# Patient Record
Sex: Female | Born: 1975
Health system: Southern US, Community
[De-identification: ages and names within clinical notes are randomized; demographics above are authoritative.]

## PROBLEM LIST (undated history)

## (undated) DIAGNOSIS — D649 Anemia, unspecified: Secondary | ICD-10-CM

## (undated) DIAGNOSIS — I10 Essential (primary) hypertension: Secondary | ICD-10-CM

## (undated) DIAGNOSIS — M329 Systemic lupus erythematosus, unspecified: Secondary | ICD-10-CM

## (undated) DIAGNOSIS — R011 Cardiac murmur, unspecified: Secondary | ICD-10-CM

## (undated) DIAGNOSIS — T7840XA Allergy, unspecified, initial encounter: Secondary | ICD-10-CM

## (undated) DIAGNOSIS — M069 Rheumatoid arthritis, unspecified: Secondary | ICD-10-CM

## (undated) DIAGNOSIS — D219 Benign neoplasm of connective and other soft tissue, unspecified: Secondary | ICD-10-CM

## (undated) DIAGNOSIS — R51 Headache: Secondary | ICD-10-CM

## (undated) DIAGNOSIS — IMO0002 Reserved for concepts with insufficient information to code with codable children: Secondary | ICD-10-CM

## (undated) DIAGNOSIS — F419 Anxiety disorder, unspecified: Secondary | ICD-10-CM

## (undated) HISTORY — PX: REDUCTION MAMMAPLASTY: SUR839

## (undated) HISTORY — DX: Allergy, unspecified, initial encounter: T78.40XA

## (undated) HISTORY — PX: ABDOMINAL HYSTERECTOMY: SHX81

## (undated) HISTORY — DX: Essential (primary) hypertension: I10

## (undated) HISTORY — PX: NO PAST SURGERIES: SHX2092

---

## 1998-02-15 ENCOUNTER — Inpatient Hospital Stay (HOSPITAL_COMMUNITY): Admission: AD | Admit: 1998-02-15 | Discharge: 1998-02-15 | Payer: Self-pay | Admitting: *Deleted

## 1999-01-08 ENCOUNTER — Emergency Department (HOSPITAL_COMMUNITY): Admission: EM | Admit: 1999-01-08 | Discharge: 1999-01-08 | Payer: Self-pay | Admitting: Emergency Medicine

## 1999-01-08 ENCOUNTER — Encounter: Payer: Self-pay | Admitting: Emergency Medicine

## 1999-05-23 ENCOUNTER — Emergency Department (HOSPITAL_COMMUNITY): Admission: EM | Admit: 1999-05-23 | Discharge: 1999-05-23 | Payer: Self-pay

## 1999-08-19 ENCOUNTER — Emergency Department (HOSPITAL_COMMUNITY): Admission: EM | Admit: 1999-08-19 | Discharge: 1999-08-19 | Payer: Self-pay | Admitting: Emergency Medicine

## 2000-04-04 ENCOUNTER — Emergency Department (HOSPITAL_COMMUNITY): Admission: EM | Admit: 2000-04-04 | Discharge: 2000-04-04 | Payer: Self-pay | Admitting: Emergency Medicine

## 2000-07-01 ENCOUNTER — Emergency Department (HOSPITAL_COMMUNITY): Admission: EM | Admit: 2000-07-01 | Discharge: 2000-07-01 | Payer: Self-pay | Admitting: Emergency Medicine

## 2000-08-18 ENCOUNTER — Emergency Department (HOSPITAL_COMMUNITY): Admission: EM | Admit: 2000-08-18 | Discharge: 2000-08-18 | Payer: Self-pay | Admitting: Emergency Medicine

## 2007-08-20 ENCOUNTER — Other Ambulatory Visit: Admission: RE | Admit: 2007-08-20 | Discharge: 2007-08-20 | Payer: Self-pay | Admitting: Family Medicine

## 2008-05-23 ENCOUNTER — Inpatient Hospital Stay (HOSPITAL_COMMUNITY): Admission: AD | Admit: 2008-05-23 | Discharge: 2008-05-23 | Payer: Self-pay | Admitting: Family Medicine

## 2008-05-23 ENCOUNTER — Ambulatory Visit: Payer: Self-pay | Admitting: Physician Assistant

## 2008-09-29 ENCOUNTER — Other Ambulatory Visit: Admission: RE | Admit: 2008-09-29 | Discharge: 2008-09-29 | Payer: Self-pay | Admitting: Family Medicine

## 2010-02-21 ENCOUNTER — Emergency Department (HOSPITAL_BASED_OUTPATIENT_CLINIC_OR_DEPARTMENT_OTHER): Admission: EM | Admit: 2010-02-21 | Discharge: 2010-02-21 | Payer: Self-pay | Admitting: Emergency Medicine

## 2010-07-31 LAB — URINALYSIS, ROUTINE W REFLEX MICROSCOPIC
Bilirubin Urine: NEGATIVE
Glucose, UA: NEGATIVE mg/dL
Hgb urine dipstick: NEGATIVE
Ketones, ur: NEGATIVE mg/dL
Nitrite: NEGATIVE
Protein, ur: NEGATIVE mg/dL
Specific Gravity, Urine: 1.025 (ref 1.005–1.030)
Urobilinogen, UA: 0.2 mg/dL (ref 0.0–1.0)
pH: 5.5 (ref 5.0–8.0)

## 2010-07-31 LAB — WET PREP, GENITAL
Clue Cells Wet Prep HPF POC: NONE SEEN
Trich, Wet Prep: NONE SEEN
Yeast Wet Prep HPF POC: NONE SEEN

## 2010-07-31 LAB — GC/CHLAMYDIA PROBE AMP, GENITAL
Chlamydia, DNA Probe: NEGATIVE
GC Probe Amp, Genital: NEGATIVE

## 2010-07-31 LAB — POCT PREGNANCY, URINE: Preg Test, Ur: NEGATIVE

## 2010-09-13 ENCOUNTER — Emergency Department (HOSPITAL_BASED_OUTPATIENT_CLINIC_OR_DEPARTMENT_OTHER)
Admission: EM | Admit: 2010-09-13 | Discharge: 2010-09-13 | Disposition: A | Discharge status: Home / Self Care | Payer: Self-pay | Attending: Emergency Medicine | Admitting: Emergency Medicine

## 2010-09-13 DIAGNOSIS — M25539 Pain in unspecified wrist: Secondary | ICD-10-CM | POA: Insufficient documentation

## 2011-03-19 ENCOUNTER — Emergency Department (INDEPENDENT_AMBULATORY_CARE_PROVIDER_SITE_OTHER): Payer: Medicaid Other

## 2011-03-19 ENCOUNTER — Emergency Department (HOSPITAL_BASED_OUTPATIENT_CLINIC_OR_DEPARTMENT_OTHER)
Admission: EM | Admit: 2011-03-19 | Discharge: 2011-03-19 | Disposition: A | Discharge status: Home / Self Care | Payer: Medicaid Other | Attending: Emergency Medicine | Admitting: Emergency Medicine

## 2011-03-19 DIAGNOSIS — R1031 Right lower quadrant pain: Secondary | ICD-10-CM

## 2011-03-19 DIAGNOSIS — R109 Unspecified abdominal pain: Secondary | ICD-10-CM | POA: Insufficient documentation

## 2011-03-19 DIAGNOSIS — R110 Nausea: Secondary | ICD-10-CM

## 2011-03-19 DIAGNOSIS — R112 Nausea with vomiting, unspecified: Secondary | ICD-10-CM | POA: Insufficient documentation

## 2011-03-19 DIAGNOSIS — R509 Fever, unspecified: Secondary | ICD-10-CM | POA: Insufficient documentation

## 2011-03-19 DIAGNOSIS — N852 Hypertrophy of uterus: Secondary | ICD-10-CM

## 2011-03-19 HISTORY — DX: Benign neoplasm of connective and other soft tissue, unspecified: D21.9

## 2011-03-19 LAB — PREGNANCY, URINE: Preg Test, Ur: NEGATIVE

## 2011-03-19 LAB — BASIC METABOLIC PANEL
BUN: 7 mg/dL (ref 6–23)
Chloride: 101 mEq/L (ref 96–112)
Creatinine, Ser: 0.6 mg/dL (ref 0.50–1.10)
GFR calc Af Amer: >90 mL/min (ref >90)
Glucose, Bld: 102 mg/dL — ABNORMAL HIGH (ref 70–99)

## 2011-03-19 LAB — DIFFERENTIAL
Basophils Relative: 0 % (ref 0–1)
Eosinophils Absolute: 0 10*3/uL (ref 0.0–0.7)
Eosinophils Relative: 0 % (ref 0–5)
Lymphocytes Relative: 11 % — ABNORMAL LOW (ref 12–46)
Neutro Abs: 10.1 10*3/uL — ABNORMAL HIGH (ref 1.7–7.7)

## 2011-03-19 LAB — CBC
HCT: 28 % — ABNORMAL LOW (ref 36.0–46.0)
Hemoglobin: 8.9 g/dL — ABNORMAL LOW (ref 12.0–15.0)
MCH: 18.5 pg — ABNORMAL LOW (ref 26.0–34.0)
MCHC: 31.8 g/dL (ref 30.0–36.0)
RBC: 4.82 MIL/uL (ref 3.87–5.11)

## 2011-03-19 LAB — URINALYSIS, ROUTINE W REFLEX MICROSCOPIC
Leukocytes, UA: NEGATIVE
Protein, ur: 30 mg/dL — AB
Urobilinogen, UA: 0.2 mg/dL (ref 0.0–1.0)

## 2011-03-19 MED ORDER — SODIUM CHLORIDE 0.9 % IV BOLUS (SEPSIS)
1000.0000 mL | Freq: Once | INTRAVENOUS | Status: AC
  Administered 2011-03-19: 1000 mL via INTRAVENOUS

## 2011-03-19 MED ORDER — MORPHINE SULFATE 4 MG/ML IJ SOLN
INTRAMUSCULAR | Status: AC
  Filled 2011-03-19: qty 1

## 2011-03-19 MED ORDER — HYDROCODONE-ACETAMINOPHEN 5-325 MG PO TABS
1.0000 | ORAL_TABLET | Freq: Once | ORAL | Status: AC
  Administered 2011-03-19: 1 via ORAL
  Filled 2011-03-19: qty 1

## 2011-03-19 MED ORDER — ONDANSETRON HCL 4 MG PO TABS: 4.0000 mg | ORAL_TABLET | Freq: Four times a day (QID) | ORAL | Status: AC

## 2011-03-19 MED ORDER — OXYCODONE-ACETAMINOPHEN 5-325 MG PO TABS: 1.0000 | ORAL_TABLET | ORAL | Status: DC | PRN

## 2011-03-19 MED ORDER — MORPHINE SULFATE 4 MG/ML IJ SOLN
4.0000 mg | Freq: Once | INTRAMUSCULAR | Status: AC
  Administered 2011-03-19: 4 mg via INTRAVENOUS

## 2011-03-19 MED ORDER — IOHEXOL 300 MG/ML  SOLN
100.0000 mL | Freq: Once | INTRAMUSCULAR | Status: AC | PRN
  Administered 2011-03-19: 100 mL via INTRAVENOUS

## 2011-03-19 MED ORDER — ONDANSETRON HCL 4 MG/2ML IJ SOLN
4.0000 mg | Freq: Once | INTRAMUSCULAR | Status: AC
  Administered 2011-03-19: 4 mg via INTRAVENOUS

## 2011-03-19 MED ORDER — ONDANSETRON HCL 4 MG/2ML IJ SOLN
INTRAMUSCULAR | Status: AC
  Filled 2011-03-19: qty 2

## 2011-03-19 NOTE — ED Notes (Signed)
Pt reports abdominal pain x 4 days with nausea, constipation.

## 2011-03-19 NOTE — ED Provider Notes (Signed)
History     CSN: [id number] Arrival date & time: 03/19/2011  6:04 PM   First MD Initiated Contact with Patient 03/19/11 1806      Chief Complaint  Patient presents with  . Abdominal Pain    (Consider location/radiation/quality/duration/timing/severity/associated sxs/prior treatment) HPI Comments: Patient presents with right lower quadrant abdominal pain since last Thursday.  Patient notes it's been gradually getting worse.  The pain does radiate to the suprapubic region.  Patient notes that her last menstrual period started on Thursday as well and she does have heavy periods related to uterine fibroids.  She is also started rebleeding today.  She has no other vaginal discharge.  She has not noted fevers at home.  She has had associated nausea and vomiting.  She did feel constipated over the weekend he did use an enema and magnesium citrate to relieve the symptoms with good results.  Despite this she has continued to have pain.  Patient denies any past abdominal surgeries.  Denies any dysuria or kidney stones.  She has had decreased by mouth intake related to her pain.  Patient is a 35 y.o. female presenting with abdominal pain. The history is provided by the patient.  Abdominal Pain The primary symptoms of the illness include abdominal pain, nausea, vomiting and vaginal bleeding. The primary symptoms of the illness do not include fever, fatigue, shortness of breath, diarrhea, dysuria or vaginal discharge. The current episode started more than 2 days ago. The onset of the illness was gradual. The problem has been gradually worsening.  The patient states that she believes she is currently not pregnant. Additional symptoms associated with the illness include anorexia and constipation. Symptoms associated with the illness do not include chills or back pain.    Past Medical History  Diagnosis Date  . Fibroids     History reviewed. No pertinent past surgical history.  No family history on  file.  History  Substance Use Topics  . Smoking status: Never Smoker   . Smokeless tobacco: Not on file  . Alcohol Use: Yes     occasional    OB History    Grav Para Term Preterm Abortions TAB SAB Ect Mult Living                  Review of Systems  Constitutional: Negative.  Negative for fever, chills and fatigue.  HENT: Negative.   Eyes: Negative.  Negative for discharge and redness.  Respiratory: Negative.  Negative for cough and shortness of breath.   Cardiovascular: Negative.  Negative for chest pain.  Gastrointestinal: Positive for nausea, vomiting, abdominal pain, constipation and anorexia. Negative for diarrhea.  Genitourinary: Positive for vaginal bleeding. Negative for dysuria and vaginal discharge.  Musculoskeletal: Negative.  Negative for back pain.  Skin: Negative.  Negative for color change and rash.  Neurological: Negative.  Negative for syncope and headaches.  Hematological: Negative.  Negative for adenopathy.  Psychiatric/Behavioral: Negative.  Negative for confusion.  All other systems reviewed and are negative.    Allergies  Review of patient's allergies indicates no known allergies.  Home Medications   Current Outpatient Rx  Name Route Sig Dispense Refill  . CLOBETASOL PROPIONATE 0.05 % EX SOLN Topical Apply topically daily as needed. For flair up     . IBUPROFEN 200 MG PO TABS Oral Take 400 mg by mouth every 6 (six) hours as needed. For pain     . MAGNESIUM CITRATE 1.745 GM/30ML PO SOLN Oral Take 1 Bottle by mouth once.      Marland Kitchen  PSEUDOEPHEDRINE HCL 30 MG PO TABS Oral Take 30 mg by mouth every 4 (four) hours as needed. For congesiton     . SALINE NASAL SPRAY 0.65 % NA SOLN Each Nare Place 1 spray into both nostrils as needed. For congestion     . DISPOSABLE ENEMA 19-7 GM/118ML RE ENEM Rectal Place 1 enema rectally once. follow package directions       BP 153/81  Pulse 122  Temp(Src) 100.1 F (37.8 C) (Oral)  Resp 16  Ht 5\' 6"  (1.676 m)  Wt 180 lb  (81.647 kg)  BMI 29.05 kg/m2  SpO2 100%  LMP 03/19/2011  Physical Exam  Constitutional: She is oriented to person, place, and time. She appears well-developed and well-nourished.  Non-toxic appearance. She does not have a sickly appearance.  HENT:  Head: Normocephalic and atraumatic.  Eyes: Conjunctivae, EOM and lids are normal. Pupils are equal, round, and reactive to light. No scleral icterus.  Neck: Trachea normal and normal range of motion. Neck supple.  Cardiovascular: Regular rhythm, S1 normal, S2 normal and normal heart sounds.  Tachycardia present.   Pulmonary/Chest: Effort normal and breath sounds normal. She has no decreased breath sounds. She has no wheezes. She has no rhonchi. She has no rales.  Abdominal: Soft. Normal appearance. There is tenderness in the right lower quadrant and suprapubic area. There is no rebound, no guarding and no CVA tenderness.  Musculoskeletal: Normal range of motion.  Neurological: She is alert and oriented to person, place, and time. She has normal strength.  Skin: Skin is warm, dry and intact. No rash noted.  Psychiatric: She has a normal mood and affect. Her behavior is normal. Judgment and thought content normal.    ED Course  Procedures (including critical care time)  Results for orders placed during the hospital encounter of 03/19/11  URINALYSIS, ROUTINE W REFLEX MICROSCOPIC      Component Value Range   Color, Urine YELLOW  YELLOW    APPearance CLEAR  CLEAR    Specific Gravity, Urine 1.019  1.005 - 1.030    pH 7.0  5.0 - 8.0    Glucose, UA NEGATIVE  NEGATIVE (mg/dL)   Hgb urine dipstick MODERATE (*) NEGATIVE    Bilirubin Urine NEGATIVE  NEGATIVE    Ketones, ur 15 (*) NEGATIVE (mg/dL)   Protein, ur 30 (*) NEGATIVE (mg/dL)   Urobilinogen, UA 0.2  0.0 - 1.0 (mg/dL)   Nitrite NEGATIVE  NEGATIVE    Leukocytes, UA NEGATIVE  NEGATIVE   PREGNANCY, URINE      Component Value Range   Preg Test, Ur NEGATIVE    URINE MICROSCOPIC-ADD ON       Component Value Range   Squamous Epithelial / LPF RARE  RARE    RBC / HPF 7-10  <3 (RBC/hpf)   Bacteria, UA FEW (*) RARE    Urine-Other MUCOUS PRESENT    CBC      Component Value Range   WBC 12.8 (*) 4.0 - 10.5 (K/uL)   RBC 4.82  3.87 - 5.11 (MIL/uL)   Hemoglobin 8.9 (*) 12.0 - 15.0 (g/dL)   HCT 28.4 (*) 13.2 - 46.0 (%)   MCV 58.1 (*) 78.0 - 100.0 (fL)   MCH 18.5 (*) 26.0 - 34.0 (pg)   MCHC 31.8  30.0 - 36.0 (g/dL)   RDW 44.0 (*) 10.2 - 15.5 (%)   Platelets 621 (*) 150 - 400 (K/uL)  DIFFERENTIAL      Component Value Range   Neutrophils Relative  79 (*) 43 - 77 (%)   Lymphocytes Relative 11 (*) 12 - 46 (%)   Monocytes Relative 10  3 - 12 (%)   Eosinophils Relative 0  0 - 5 (%)   Basophils Relative 0  0 - 1 (%)   Neutro Abs 10.1 (*) 1.7 - 7.7 (K/uL)   Lymphs Abs 1.4  0.7 - 4.0 (K/uL)   Monocytes Absolute 1.3 (*) 0.1 - 1.0 (K/uL)   Eosinophils Absolute 0.0  0.0 - 0.7 (K/uL)   Basophils Absolute 0.0  0.0 - 0.1 (K/uL)  BASIC METABOLIC PANEL      Component Value Range   Sodium 136  135 - 145 (mEq/L)   Potassium 3.8  3.5 - 5.1 (mEq/L)   Chloride 101  96 - 112 (mEq/L)   CO2 24  19 - 32 (mEq/L)   Glucose, Bld 102 (*) 70 - 99 (mg/dL)   BUN 7  6 - 23 (mg/dL)   Creatinine, Ser 9.60  0.50 - 1.10 (mg/dL)   Calcium 9.6  8.4 - 45.4 (mg/dL)   GFR calc non Af Amer >90  >90 (mL/min)   GFR calc Af Amer >90  >90 (mL/min)   Ct Abdomen Pelvis W Contrast  03/19/2011  *RADIOLOGY REPORT*  Clinical Data: Right lower quadrant abdominal pain with nausea and fever for 4 days.  Question appendicitis.  CT ABDOMEN AND PELVIS WITH CONTRAST  Technique:  Multidetector CT imaging of the abdomen and pelvis was performed following the standard protocol during bolus administration of intravenous contrast.  Contrast: OMNIPAQUE IOHEXOL 300 MG/ML IV SOLN  Comparison: None.  Findings: There is a 4 mm subpleural nodule at the right lung base on image 6 which may reflect a small intrapulmonary lymph node. The lung  bases are otherwise clear.  There is no pleural effusion. A small hiatal hernia is noted.  The liver, spleen, gallbladder, pancreas, adrenal glands and kidneys appear normal.  There is no hydronephrosis.  The bowel gas pattern is normal.  No right lower quadrant inflammatory changes are identified.  The appendix is air-filled and normal in caliber without surrounding inflammation.  The appendix is best seen on axial image 52.  The uterus is moderately enlarged, measuring up to 16 cm in length and 15.4 cm transverse.  The uterine dimensions are diffusely irregular.  There is a large central low density lesion within the uterus, measuring 7.8 x 7.3 cm transverse on image 60.  There is no adnexal mass or pelvic inflammatory process.  The urinary bladder appears unremarkable.  No osseous abnormalities are identified.  IMPRESSION:  1.  No evidence of appendicitis. 2.  Moderate enlargement of the uterus with lobularity consistent with fibroids.  A large low density lesion centrally in the uterus is most likely a degenerated fibroid.  No adnexal abnormalities are identified. 3.  The abdominal visceral organs appear unremarkable.  Original Report Authenticated By: Gerrianne Scale, M.D.      MDM  Patient with unclear etiology for her symptoms.  She has no signs of appendicitis or other colitis on her CT abdomen pelvis.  She has no symptoms consistent with UTI in the blood in her urine is likely due to her abnormal vaginal bleeding related to known fibroids.  She has no symptoms for PID.  Patient has no significant respiratory symptoms to suggest pneumonia or that this is influenza.  Patient may have a viral gastroenteritis given her nausea vomiting and abdominal pain.  Patient has had some continued tachycardia that  appears to be related to her fever.  Patient is now tolerating by mouth and is starting to feel better with the IV fluids and pain medications.  I have advised the patient to return if she has continued  pain, fevers, vomiting and bleeding.  She also knows that she should followup with the gynecologist for her continued bleeding with her fibroids as well.       Nat Christen, MD 03/19/11 [phone]

## 2011-03-21 ENCOUNTER — Inpatient Hospital Stay (HOSPITAL_COMMUNITY): Payer: Medicaid Other

## 2011-03-21 ENCOUNTER — Encounter (HOSPITAL_COMMUNITY): Payer: Self-pay | Admitting: *Deleted

## 2011-03-21 ENCOUNTER — Inpatient Hospital Stay (HOSPITAL_COMMUNITY)
Admission: AD | Admit: 2011-03-21 | Discharge: 2011-03-22 | Disposition: A | Discharge status: Home / Self Care | Payer: Medicaid Other | Source: Ambulatory Visit | Attending: Family Medicine | Admitting: Family Medicine

## 2011-03-21 DIAGNOSIS — D259 Leiomyoma of uterus, unspecified: Secondary | ICD-10-CM | POA: Diagnosis present

## 2011-03-21 DIAGNOSIS — R109 Unspecified abdominal pain: Secondary | ICD-10-CM | POA: Insufficient documentation

## 2011-03-21 HISTORY — DX: Reserved for concepts with insufficient information to code with codable children: IMO0002

## 2011-03-21 HISTORY — DX: Systemic lupus erythematosus, unspecified: M32.9

## 2011-03-21 LAB — CBC
HCT: 25.3 % — ABNORMAL LOW (ref 36.0–46.0)
Hemoglobin: 7.7 g/dL — ABNORMAL LOW (ref 12.0–15.0)
MCH: 18.3 pg — ABNORMAL LOW (ref 26.0–34.0)
MCHC: 30.4 g/dL (ref 30.0–36.0)
MCV: 60.2 fL — ABNORMAL LOW (ref 78.0–100.0)
RDW: 18.7 % — ABNORMAL HIGH (ref 11.5–15.5)

## 2011-03-21 MED ORDER — KETOROLAC TROMETHAMINE 60 MG/2ML IM SOLN
60.0000 mg | Freq: Once | INTRAMUSCULAR | Status: AC
  Administered 2011-03-21: 60 mg via INTRAMUSCULAR
  Filled 2011-03-21: qty 2

## 2011-03-21 MED ORDER — PROMETHAZINE HCL 25 MG/ML IJ SOLN
25.0000 mg | Freq: Once | INTRAMUSCULAR | Status: AC
  Administered 2011-03-21: 25 mg via INTRAMUSCULAR

## 2011-03-21 MED ORDER — PROMETHAZINE HCL 25 MG/ML IJ SOLN
25.0000 mg | Freq: Once | INTRAMUSCULAR | Status: DC
  Filled 2011-03-21: qty 1

## 2011-03-21 NOTE — Progress Notes (Signed)
Pt with fibroids LMP 11/29, continues to have bleed and passing clots, having intense lower abd pain.

## 2011-03-21 NOTE — ED Provider Notes (Signed)
History     Chief Complaint  Patient presents with  . Abdominal Pain  . Vaginal Bleeding  . Fibroids   HPI 35 y.o. G2P1011. Pain started Saturday with period, heavy bleeding, seen on Tuesday at [facility], had CT showing fibroids. Percocet 5/325 q 4 hours, helps for about an hour, then pain comes back.     Past Medical History  Diagnosis Date  . Fibroids   . Lupus     Of the Skin only    Past Surgical History  Procedure Date  . No past surgeries     Family History  Problem Relation Age of Onset  . Diabetes Mother   . Hypertension Mother   . Hyperlipidemia Father   . Cancer Paternal Aunt   . Diabetes Maternal Grandmother   . Diabetes Paternal Grandmother     History  Substance Use Topics  . Smoking status: Never Smoker   . Smokeless tobacco: Not on file  . Alcohol Use: Yes     occasional    Allergies: No Known Allergies  Prescriptions prior to admission  Medication Sig Dispense Refill  . clobetasol (TEMOVATE) 0.05 % external solution Apply topically daily as needed. For flair up       . ibuprofen (ADVIL,MOTRIN) 200 MG tablet Take 400 mg by mouth every 6 (six) hours as needed. For pain       . magnesium citrate 1.745 GM/30ML SOLN Take 1 Bottle by mouth once.        . ondansetron (ZOFRAN) 4 MG tablet Take 1 tablet (4 mg total) by mouth every 6 (six) hours.  12 tablet  0  . oxyCODONE-acetaminophen (PERCOCET) 5-325 MG per tablet Take 1 tablet by mouth every 4 (four) hours as needed for pain.  10 tablet  0  . pseudoephedrine (SUDAFED) 30 MG tablet Take 30 mg by mouth every 4 (four) hours as needed. For congesiton       . sodium chloride (OCEAN) 0.65 % nasal spray Place 1 spray into both nostrils as needed. For congestion       . sodium phosphate (FLEET) enema Place 1 enema rectally once. follow package directions         Review of Systems  Constitutional: Negative.   Respiratory: Negative.   Cardiovascular: Negative.   Gastrointestinal: Positive for nausea, vomiting  and abdominal pain. Negative for diarrhea and constipation.  Genitourinary: Negative for dysuria, urgency, frequency, hematuria and flank pain.       Positive vaginal bleeding   Musculoskeletal: Negative.   Neurological: Negative.   Psychiatric/Behavioral: Negative.    Physical Exam   Blood pressure 136/69, pulse 118, temperature 99.7 F (37.6 C), temperature source Oral, resp. rate 16, height 5\' 6"  (1.676 m), weight 187 lb 6.4 oz (85.004 kg), last menstrual period 03/14/2011.  Physical Exam  Constitutional: She is oriented to person, place, and time. She appears well-developed and well-nourished. No distress.  HENT:  Head: Normocephalic and atraumatic.  Cardiovascular: Normal rate, regular rhythm and normal heart sounds.   Respiratory: Effort normal and breath sounds normal. No respiratory distress.  GI: Soft. Bowel sounds are normal. She exhibits no distension and no mass. There is no tenderness. There is no rebound and no guarding.  Genitourinary: There is no rash or lesion on the right labia. There is no rash or lesion on the left labia. There is bleeding (small) around the vagina. No erythema or tenderness around the vagina. No vaginal discharge found.  Neurological: She is alert and oriented to  person, place, and time.  Skin: Skin is warm and dry.  Psychiatric: She has a normal mood and affect.    MAU Course  Procedures  Results for orders placed during the hospital encounter of 03/21/11 (from the past 24 hour(s))  CBC     Status: Abnormal   Collection Time   03/21/11 10:05 PM      Component Value Range   WBC 12.0 (*) 4.0 - 10.5 (K/uL)   RBC 4.20  3.87 - 5.11 (MIL/uL)   Hemoglobin 7.7 (*) 12.0 - 15.0 (g/dL)   HCT 16.1 (*) 09.6 - 46.0 (%)   MCV 60.2 (*) 78.0 - 100.0 (fL)   MCH 18.3 (*) 26.0 - 34.0 (pg)   MCHC 30.4  30.0 - 36.0 (g/dL)   RDW 04.5 (*) 40.9 - 15.5 (%)   Platelets 551 (*) 150 - 400 (K/uL)  WET PREP, GENITAL     Status: Abnormal   Collection Time   03/22/11  12:10 AM      Component Value Range   Yeast, Wet Prep NONE SEEN  NONE SEEN    Trich, Wet Prep NONE SEEN  NONE SEEN    Clue Cells, Wet Prep RARE (*) NONE SEEN    WBC, Wet Prep HPF POC FEW (*) NONE SEEN    HGB decreased from 8.9 on 12/4. Orthostatics negative. Pt denies symptoms. Bleeding light now.   Good pain relief with Toradol.   US Transvaginal Non-ob  03/21/2011  *RADIOLOGY REPORT*  Clinical Data: Right lower quadrant abdominal pain, vaginal bleeding; history of fibroids.  TRANSABDOMINAL AND TRANSVAGINAL ULTRASOUND OF PELVIS Technique:  Both transabdominal and transvaginal ultrasound examinations of the pelvis were performed. Transabdominal technique was performed for global imaging of the pelvis including uterus, ovaries, adnexal regions, and pelvic cul-de-sac.  Comparison: CT of the abdomen and pelvis performed 03/19/2011   It was necessary to proceed with endovaginal exam following the transabdominal exam to visualize the uterus and ovaries in greater detail.  Findings:  Uterus: Diffusely enlarged, as noted on CT.  The uterus measures 14.4 x 9.2 x 12.5 cm.  Multiple fibroids are noted within the uterus, the largest of which is on the right side of the uterus, likely intramural in nature, measuring 7.0 x 5.9 x 5.8 cm. The intramural nature is suggested on the prior CT, but not well characterized on ultrasound.  There is a smaller 3.4 x 3.0 x 2.6 cm fibroid noted at the fundus of the uterus, likely also intramural in nature, and a partially exophytic fibroid arising along the left uterine wall, measuring 2.1 cm in size.  Endometrium: Not characterized due to displacement by the large uterine fibroids.  Right ovary:  Normal appearance/no adnexal mass; measures 2.9 x 2.4 x 1.9 cm.  Left ovary: Normal appearance/no adnexal mass; measures 3.2 x 2.4 x 2.1 cm.  Limited color Doppler evaluation demonstrates normal color Doppler blood flow with respect to both ovaries; there is no evidence for ovarian  torsion.  Other findings: No free fluid is seen within the pelvic cul-de-sac.  IMPRESSION:  1.  Diffusely enlarged uterus, with multiple large fibroids again seen, as described above.  The endometrial echo complex is not characterized due to displacement by large uterine fibroids. 2.  No evidence for ovarian torsion.  Original Report Authenticated By: Tonia Ghent, M.D.   US Pelvis Complete  03/21/2011  *RADIOLOGY REPORT*  Clinical Data: Right lower quadrant abdominal pain, vaginal bleeding; history of fibroids.  TRANSABDOMINAL AND TRANSVAGINAL ULTRASOUND OF PELVIS Technique:  Both transabdominal and transvaginal ultrasound examinations of the pelvis were performed. Transabdominal technique was performed for global imaging of the pelvis including uterus, ovaries, adnexal regions, and pelvic cul-de-sac.  Comparison: CT of the abdomen and pelvis performed 03/19/2011   It was necessary to proceed with endovaginal exam following the transabdominal exam to visualize the uterus and ovaries in greater detail.  Findings:  Uterus: Diffusely enlarged, as noted on CT.  The uterus measures 14.4 x 9.2 x 12.5 cm.  Multiple fibroids are noted within the uterus, the largest of which is on the right side of the uterus, likely intramural in nature, measuring 7.0 x 5.9 x 5.8 cm. The intramural nature is suggested on the prior CT, but not well characterized on ultrasound.  There is a smaller 3.4 x 3.0 x 2.6 cm fibroid noted at the fundus of the uterus, likely also intramural in nature, and a partially exophytic fibroid arising along the left uterine wall, measuring 2.1 cm in size.  Endometrium: Not characterized due to displacement by the large uterine fibroids.  Right ovary:  Normal appearance/no adnexal mass; measures 2.9 x 2.4 x 1.9 cm.  Left ovary: Normal appearance/no adnexal mass; measures 3.2 x 2.4 x 2.1 cm.  Limited color Doppler evaluation demonstrates normal color Doppler blood flow with respect to both ovaries; there is  no evidence for ovarian torsion.  Other findings: No free fluid is seen within the pelvic cul-de-sac.  IMPRESSION:  1.  Diffusely enlarged uterus, with multiple large fibroids again seen, as described above.  The endometrial echo complex is not characterized due to displacement by large uterine fibroids. 2.  No evidence for ovarian torsion.  Original Report Authenticated By: Tonia Ghent, M.D.   Ct Abdomen Pelvis W Contrast  03/19/2011  *RADIOLOGY REPORT*  Clinical Data: Right lower quadrant abdominal pain with nausea and fever for 4 days.  Question appendicitis.  CT ABDOMEN AND PELVIS WITH CONTRAST  Technique:  Multidetector CT imaging of the abdomen and pelvis was performed following the standard protocol during bolus administration of intravenous contrast.  Contrast: OMNIPAQUE IOHEXOL 300 MG/ML IV SOLN  Comparison: None.  Findings: There is a 4 mm subpleural nodule at the right lung base on image 6 which may reflect a small intrapulmonary lymph node. The lung bases are otherwise clear.  There is no pleural effusion. A small hiatal hernia is noted.  The liver, spleen, gallbladder, pancreas, adrenal glands and kidneys appear normal.  There is no hydronephrosis.  The bowel gas pattern is normal.  No right lower quadrant inflammatory changes are identified.  The appendix is air-filled and normal in caliber without surrounding inflammation.  The appendix is best seen on axial image 52.  The uterus is moderately enlarged, measuring up to 16 cm in length and 15.4 cm transverse.  The uterine dimensions are diffusely irregular.  There is a large central low density lesion within the uterus, measuring 7.8 x 7.3 cm transverse on image 60.  There is no adnexal mass or pelvic inflammatory process.  The urinary bladder appears unremarkable.  No osseous abnormalities are identified.  IMPRESSION:  1.  No evidence of appendicitis. 2.  Moderate enlargement of the uterus with lobularity consistent with fibroids.  A large  low density lesion centrally in the uterus is most likely a degenerated fibroid.  No adnexal abnormalities are identified. 3.  The abdominal visceral organs appear unremarkable.  Original Report Authenticated By: Gerrianne Scale, M.D.    Assessment and Plan  35 y.o. [id number]  with fibroids F/U in GYN clinic  Medication List  As of 03/22/2011  4:01 AM   START taking these medications         ferrous sulfate 325 (65 FE) MG tablet   Take 1 tablet (325 mg total) by mouth 3 (three) times daily with meals.      naproxen 500 MG tablet   Commonly known as: NAPROSYN   Take 1 tablet (500 mg total) by mouth 2 (two) times daily with a meal.      promethazine 25 MG tablet   Commonly known as: PHENERGAN   Take 1 tablet (25 mg total) by mouth every 6 (six) hours as needed for nausea.         CHANGE how you take these medications         oxyCODONE-acetaminophen 5-325 MG per tablet   Commonly known as: PERCOCET   Take 2 tablets by mouth every 4 (four) hours as needed for pain.   What changed: dose         CONTINUE taking these medications         clobetasol 0.05 % external solution   Commonly known as: TEMOVATE      magnesium citrate 1.745 GM/30ML Soln      ondansetron 4 MG tablet   Commonly known as: ZOFRAN   Take 1 tablet (4 mg total) by mouth every 6 (six) hours.      pseudoephedrine 30 MG tablet   Commonly known as: SUDAFED      sodium chloride 0.65 % nasal spray   Commonly known as: OCEAN      sodium phosphate enema   Commonly known as: FLEET         STOP taking these medications         ibuprofen 200 MG tablet          Where to get your medications    These are the prescriptions that you need to pick up. We sent them to a specific pharmacy, so you will need to go there to get them.   [facility] - [place], [place] - [address]    [address] [place] [address]    Phone: [phone]        ferrous sulfate 325 (65 FE) MG tablet     naproxen 500 MG tablet   promethazine 25 MG tablet         You may get these medications from any pharmacy.         oxyCODONE-acetaminophen 5-325 MG per tablet             FRAZIER,NATALIE 03/21/2011, 10:19 PM

## 2011-03-22 ENCOUNTER — Encounter (HOSPITAL_COMMUNITY): Payer: Self-pay | Admitting: Advanced Practice Midwife

## 2011-03-22 DIAGNOSIS — D259 Leiomyoma of uterus, unspecified: Secondary | ICD-10-CM | POA: Diagnosis present

## 2011-03-22 LAB — GC/CHLAMYDIA PROBE AMP, GENITAL
Chlamydia, DNA Probe: NEGATIVE
GC Probe Amp, Genital: NEGATIVE

## 2011-03-22 MED ORDER — NAPROXEN 500 MG PO TABS: 500.0000 mg | ORAL_TABLET | Freq: Two times a day (BID) | ORAL | Status: AC

## 2011-03-22 MED ORDER — FERROUS SULFATE 325 (65 FE) MG PO TABS: 325.0000 mg | ORAL_TABLET | Freq: Three times a day (TID) | ORAL | Status: DC

## 2011-03-22 MED ORDER — PROMETHAZINE HCL 25 MG PO TABS: 25.0000 mg | ORAL_TABLET | Freq: Four times a day (QID) | ORAL | Status: DC | PRN

## 2011-03-22 MED ORDER — OXYCODONE-ACETAMINOPHEN 5-325 MG PO TABS: 2.0000 | ORAL_TABLET | ORAL | Status: AC | PRN

## 2011-03-22 NOTE — ED Provider Notes (Signed)
Chart reviewed and agree with management and plan.  

## 2011-04-17 ENCOUNTER — Encounter: Payer: Self-pay | Admitting: Obstetrics & Gynecology

## 2011-10-10 ENCOUNTER — Emergency Department (HOSPITAL_BASED_OUTPATIENT_CLINIC_OR_DEPARTMENT_OTHER): Payer: Self-pay

## 2011-10-10 ENCOUNTER — Emergency Department (HOSPITAL_BASED_OUTPATIENT_CLINIC_OR_DEPARTMENT_OTHER)
Admission: EM | Admit: 2011-10-10 | Discharge: 2011-10-10 | Disposition: A | Discharge status: Home / Self Care | Payer: Self-pay | Attending: Emergency Medicine | Admitting: Emergency Medicine

## 2011-10-10 ENCOUNTER — Encounter (HOSPITAL_BASED_OUTPATIENT_CLINIC_OR_DEPARTMENT_OTHER): Payer: Self-pay | Admitting: Emergency Medicine

## 2011-10-10 DIAGNOSIS — R109 Unspecified abdominal pain: Secondary | ICD-10-CM | POA: Insufficient documentation

## 2011-10-10 DIAGNOSIS — R112 Nausea with vomiting, unspecified: Secondary | ICD-10-CM | POA: Insufficient documentation

## 2011-10-10 DIAGNOSIS — R197 Diarrhea, unspecified: Secondary | ICD-10-CM | POA: Insufficient documentation

## 2011-10-10 LAB — CBC WITH DIFFERENTIAL/PLATELET
Basophils Relative: 0 % (ref 0–1)
Eosinophils Absolute: 0 10*3/uL (ref 0.0–0.7)
HCT: 37.8 % (ref 36.0–46.0)
Hemoglobin: 13.4 g/dL (ref 12.0–15.0)
Lymphs Abs: 0.6 10*3/uL — ABNORMAL LOW (ref 0.7–4.0)
MCH: 27.6 pg (ref 26.0–34.0)
MCHC: 35.4 g/dL (ref 30.0–36.0)
Monocytes Absolute: 0.5 10*3/uL (ref 0.1–1.0)
Monocytes Relative: 4 % (ref 3–12)
Neutro Abs: 9.8 10*3/uL — ABNORMAL HIGH (ref 1.7–7.7)
RBC: 4.85 MIL/uL (ref 3.87–5.11)

## 2011-10-10 LAB — URINALYSIS, ROUTINE W REFLEX MICROSCOPIC
Bilirubin Urine: NEGATIVE
Ketones, ur: NEGATIVE mg/dL
Nitrite: NEGATIVE
Specific Gravity, Urine: 1.02 (ref 1.005–1.030)
pH: 8 (ref 5.0–8.0)

## 2011-10-10 LAB — URINE MICROSCOPIC-ADD ON

## 2011-10-10 LAB — BASIC METABOLIC PANEL
BUN: 10 mg/dL (ref 6–23)
Chloride: 100 mEq/L (ref 96–112)
Creatinine, Ser: 0.6 mg/dL (ref 0.50–1.10)
GFR calc Af Amer: >90 mL/min (ref >90)
Glucose, Bld: 137 mg/dL — ABNORMAL HIGH (ref 70–99)

## 2011-10-10 LAB — LIPASE, BLOOD: Lipase: 21 U/L (ref 11–59)

## 2011-10-10 MED ORDER — ONDANSETRON 8 MG PO TBDP
8.0000 mg | ORAL_TABLET | Freq: Once | ORAL | Status: AC
  Administered 2011-10-10: 8 mg via ORAL
  Filled 2011-10-10: qty 1

## 2011-10-10 MED ORDER — PROMETHAZINE HCL 12.5 MG RE SUPP: 12.5000 mg | Freq: Four times a day (QID) | RECTAL | Status: DC | PRN

## 2011-10-10 MED ORDER — TRAMADOL HCL 50 MG PO TABS: 50.0000 mg | ORAL_TABLET | Freq: Four times a day (QID) | ORAL | Status: AC | PRN

## 2011-10-10 MED ORDER — FENTANYL CITRATE 0.05 MG/ML IJ SOLN
50.0000 ug | Freq: Once | INTRAMUSCULAR | Status: AC
  Administered 2011-10-10: 50 ug via INTRAMUSCULAR
  Filled 2011-10-10: qty 2

## 2011-10-10 MED ORDER — KETOROLAC TROMETHAMINE 60 MG/2ML IM SOLN
60.0000 mg | Freq: Once | INTRAMUSCULAR | Status: AC
Start: 2011-10-10 — End: 2011-10-10
  Administered 2011-10-10: 60 mg via INTRAMUSCULAR
  Filled 2011-10-10: qty 2

## 2011-10-10 NOTE — ED Provider Notes (Signed)
History     CSN: [id number]  Arrival date & time 10/10/11  0548   None     Chief Complaint  Patient presents with  . Abdominal Pain    (Consider location/radiation/quality/duration/timing/severity/associated sxs/prior treatment) Patient is a 36 y.o. female presenting with cramps. The history is provided by the patient.  Abdominal Cramping The primary symptoms of the illness include nausea, vomiting and diarrhea. The primary symptoms of the illness do not include shortness of breath or vaginal discharge. The current episode started 6 to 12 hours ago. The onset of the illness was gradual. The problem has not changed since onset. The vomiting began yesterday. Vomiting occurs 2 to 5 times per day. The emesis contains stomach contents. Risk factors for illness leading to emesis include suspect food intake.  The diarrhea began today. The diarrhea is watery. The diarrhea occurs 2 to 4 times per day. Risk factors for illness producing diarrhea include suspect food intake.  Associated with: none though she has had similar in the past and is on her menstrual cycle and usually gets bad cramps with it. The patient states that she believes she is currently not pregnant. The patient has had a change in bowel habit. Risk factors: none. Symptoms associated with the illness do not include chills, anorexia, diaphoresis, heartburn, constipation, urgency, hematuria, frequency or back pain.    Past Medical History  Diagnosis Date  . Fibroids   . Lupus     Of the Skin only    Past Surgical History  Procedure Date  . No past surgeries     Family History  Problem Relation Age of Onset  . Diabetes Mother   . Hypertension Mother   . Hyperlipidemia Father   . Cancer Paternal Aunt   . Diabetes Maternal Grandmother   . Diabetes Paternal Grandmother     History  Substance Use Topics  . Smoking status: Never Smoker   . Smokeless tobacco: Not on file  . Alcohol Use: Yes     occasional    OB  History    Grav Para Term Preterm Abortions TAB SAB Ect Mult Living   2 1 1  0 1 1 0 0 0 1      Review of Systems  Constitutional: Negative for chills and diaphoresis.  Respiratory: Negative for chest tightness and shortness of breath.   Cardiovascular: Negative for chest pain.  Gastrointestinal: Positive for nausea, vomiting and diarrhea. Negative for heartburn, constipation and anorexia.  Genitourinary: Negative for urgency, frequency, hematuria and vaginal discharge.  Musculoskeletal: Negative for back pain.  All other systems reviewed and are negative.    Allergies  Review of patient's allergies indicates no known allergies.  Home Medications   Current Outpatient Rx  Name Route Sig Dispense Refill  . CLOBETASOL PROPIONATE 0.05 % EX SOLN Topical Apply topically daily as needed. For flair up     . FERROUS SULFATE 325 (65 FE) MG PO TABS Oral Take 1 tablet (325 mg total) by mouth 3 (three) times daily with meals. 90 tablet 11  . MAGNESIUM CITRATE 1.745 GM/30ML PO SOLN Oral Take 1 Bottle by mouth once.      Marland Kitchen NAPROXEN 500 MG PO TABS Oral Take 1 tablet (500 mg total) by mouth 2 (two) times daily with a meal. 60 tablet 2  . PSEUDOEPHEDRINE HCL 30 MG PO TABS Oral Take 30 mg by mouth every 4 (four) hours as needed. For congesiton     . SALINE NASAL SPRAY 0.65 % NA SOLN  Each Nare Place 1 spray into both nostrils as needed. For congestion     . DISPOSABLE ENEMA 19-7 GM/118ML RE ENEM Rectal Place 1 enema rectally once. follow package directions       BP 137/77  Pulse 93  Temp 98 F (36.7 C)  Resp 16  SpO2 99%  LMP 10/05/2011  Physical Exam  Constitutional: She is oriented to person, place, and time. She appears well-developed and well-nourished. No distress.  HENT:  Head: Normocephalic and atraumatic.  Mouth/Throat: Oropharynx is clear and moist.  Eyes: Conjunctivae are normal. Pupils are equal, round, and reactive to light.  Neck: Normal range of motion. Neck supple.    Cardiovascular: Normal rate and regular rhythm.   Pulmonary/Chest: Effort normal and breath sounds normal. She has no wheezes. She has no rales.  Abdominal: Soft. Bowel sounds are normal. There is no tenderness. There is no rebound and no guarding.  Musculoskeletal: Normal range of motion.  Neurological: She is alert and oriented to person, place, and time.  Skin: Skin is warm and dry.  Psychiatric: She has a normal mood and affect.    ED Course  Procedures (including critical care time)  Labs Reviewed  CBC WITH DIFFERENTIAL - Abnormal; Notable for the following:    WBC 10.9 (*)     MCV 77.9 (*)     Neutrophils Relative 90 (*)     Neutro Abs 9.8 (*)     Lymphocytes Relative 6 (*)     Lymphs Abs 0.6 (*)     All other components within normal limits  BASIC METABOLIC PANEL  URINALYSIS, ROUTINE W REFLEX MICROSCOPIC  PREGNANCY, URINE  LIPASE, BLOOD   No results found.   No diagnosis found.    MDM  Urine was not catheterized, it is contaminated and the patient is having her menstrual cycle as explanation for RBCs.    Sleeping in room on reentry.  Suspect viral enteritis.  Works around [employer] and exam reassuring.  Return for fevers right lower quadrant pain or any concerns.  Follow up with your family doctor        Veleria Barnhardt K Cevin Rubinstein-Rasch, MD 10/10/11 [phone]

## 2011-10-10 NOTE — ED Notes (Signed)
Pt c/o abdominal cramping since 10 pm, started vomiting this am

## 2012-05-11 ENCOUNTER — Encounter: Payer: Medicaid Other | Admitting: Obstetrics & Gynecology

## 2012-05-18 ENCOUNTER — Ambulatory Visit (INDEPENDENT_AMBULATORY_CARE_PROVIDER_SITE_OTHER): Payer: Self-pay | Admitting: Obstetrics and Gynecology

## 2012-05-18 ENCOUNTER — Encounter: Payer: Self-pay | Admitting: Obstetrics and Gynecology

## 2012-05-18 ENCOUNTER — Other Ambulatory Visit (HOSPITAL_COMMUNITY)
Admission: RE | Admit: 2012-05-18 | Discharge: 2012-05-18 | Disposition: A | Discharge status: Home / Self Care | Payer: Self-pay | Source: Ambulatory Visit | Attending: Obstetrics and Gynecology | Admitting: Obstetrics and Gynecology

## 2012-05-18 VITALS — BP 130/76 | HR 90 | Temp 98.6°F | Ht 65.5 in | Wt 196.0 lb

## 2012-05-18 DIAGNOSIS — N938 Other specified abnormal uterine and vaginal bleeding: Secondary | ICD-10-CM | POA: Insufficient documentation

## 2012-05-18 DIAGNOSIS — D259 Leiomyoma of uterus, unspecified: Secondary | ICD-10-CM

## 2012-05-18 DIAGNOSIS — Z01812 Encounter for preprocedural laboratory examination: Secondary | ICD-10-CM

## 2012-05-18 DIAGNOSIS — N949 Unspecified condition associated with female genital organs and menstrual cycle: Secondary | ICD-10-CM

## 2012-05-18 NOTE — Progress Notes (Signed)
  Subjective:    Patient ID: Holly Valencia, female    DOB: 1975/11/19, 37 y.o.   MRN: [id number]  HPI  37 yo G2P1011 with LMP 04/24/2012 with BMI 32 presenting today for evaluation of fibroid uterus. Patient with known fibroid uterus was seen last year by [facility] but secondary to lack of insurance was not able to afford surgical intervention. Patient presents today reporting irregular cycles lasting 7-21 days accompanied with clots. She denies chest pain, SOB, lightheadedness/dizziness. Patient desires definitive treatment.  Past Medical History  Diagnosis Date  . Fibroids   . Lupus     Of the Skin only   Past Surgical History  Procedure Date  . No past surgeries    Family History  Problem Relation Age of Onset  . Diabetes Mother   . Hypertension Mother   . Hyperlipidemia Father   . Cancer Paternal Aunt   . Diabetes Maternal Grandmother   . Diabetes Paternal Grandmother    History  Substance Use Topics  . Smoking status: Never Smoker   . Smokeless tobacco: Never Used  . Alcohol Use: No     Comment: occasional     Review of Systems     Objective:   Physical Exam  GENERAL: Well-developed, well-nourished female in no acute distress.  HEENT: Normocephalic, atraumatic. Sclerae anicteric.  NECK: Supple. Normal thyroid.  LUNGS: Clear to auscultation bilaterally.  HEART: Regular rate and rhythm. ABDOMEN: Soft, nontender, nondistended. No organomegaly. PELVIC: Normal external female genitalia. Vagina is pink and rugated.  Normal discharge. Normal appearing cervix. Uterus is 18- week size, slightly mobile on right side. No adnexal mass or tenderness. EXTREMITIES: No cyanosis, clubbing, or edema, 2+ distal pulses.     Assessment & Plan:  37 yo with fibroid uterus - [facility] - Will obtain pelvic ultrasound to assess evolution of fibroid uterus - Endometrial biospy ENDOMETRIAL BIOPSY     The indications for endometrial  biopsy were reviewed.   Risks of the biopsy including cramping, bleeding, infection, uterine perforation, inadequate specimen and need for additional procedures  were discussed. The patient states she understands and agrees to undergo procedure today. Consent was signed. Time out was performed. Urine HCG was negative. A sterile speculum was placed in the patient's vagina and the cervix was prepped with Betadine. A single-toothed tenaculum was placed on the anterior lip of the cervix to stabilize it. The uterine cavity was sounded to a depth of 16 cm using the uterine sound. The 3 mm pipelle was introduced into the endometrial cavity without difficulty, 2 passes were made.  A  moderate amount of tissue was  sent to pathology. The instruments were removed from the patient's vagina. Minimal bleeding from the cervix was noted. The patient tolerated the procedure well.  Routine post-procedure instructions were given to the patient. The patient will follow up in two weeks to review the results and for further management.   - Patient cannot afford to be out of work without pay for 6 weeks and is interested in minimally invasive surgery. Will discuss mode of surgery pending ultrasound.

## 2012-05-18 NOTE — Patient Instructions (Signed)
Hysterectomy Information  A hysterectomy is a procedure where your uterus is surgically removed. It will no longer be possible to have menstrual periods or to become pregnant. The tubes and ovaries can be removed (bilateral salpingo-oopherectomy) during this surgery as well.  REASONS FOR A HYSTERECTOMY  Persistent, abnormal bleeding.  Lasting (chronic) pelvic pain or infection.  The lining of the uterus (endometrium) starts growing outside the uterus (endometriosis).  The endometrium starts growing in the muscle of the uterus (adenomyosis).  The uterus falls down into the vagina (pelvic organ prolapse).  Symptomatic uterine fibroids.  Precancerous cells.  Cervical cancer or uterine cancer. TYPES OF HYSTERECTOMIES  Supracervical hysterectomy. This type removes the top part of the uterus, but not the cervix.  Total hysterectomy. This type removes the uterus and cervix.  Radical hysterectomy. This type removes the uterus, cervix, and the fibrous tissue that holds the uterus in place in the pelvis (parametrium). WAYS A HYSTERECTOMY CAN BE PERFORMED  Abdominal hysterectomy. A large surgical cut (incision) is made in the abdomen. The uterus is removed through this incision.  Vaginal hysterectomy. An incision is made in the vagina. The uterus is removed through this incision. There are no abdominal incisions.  Conventional laparoscopic hysterectomy. A thin, lighted tube with a camera (laparoscope) is inserted into 3 or 4 small incisions in the abdomen. The uterus is cut into small pieces. The small pieces are removed through the incisions, or they are removed through the vagina.  Laparoscopic assisted vaginal hysterectomy (LAVH). Three or four small incisions are made in the abdomen. Part of the surgery is performed laparoscopically and part vaginally. The uterus is removed through the vagina.  Robot-assisted laparoscopic hysterectomy. A laparoscope is inserted into 3 or 4 small  incisions in the abdomen. A computer-controlled device is used to give the surgeon a 3D image. This allows for more precise movements of surgical instruments. The uterus is cut into small pieces and removed through the incisions or removed through the vagina. RISKS OF HYSTERECTOMY   Bleeding and risk of blood transfusion. Tell your caregiver if you do not want to receive any blood products.  Blood clots in the legs or lung.  Infection.  Injury to surrounding organs.  Anesthesia problems or side effects.  Conversion to an abdominal hysterectomy. WHAT TO EXPECT AFTER A HYSTERECTOMY  You will be given pain medicine.  You will need to have someone with you for the first 3 to 5 days after you go home.  You will need to follow up with your surgeon in 2 to 4 weeks after surgery to evaluate your progress.  You may have early menopause symptoms like hot flashes, night sweats, and insomnia.  If you had a hysterectomy for a problem that was not a cancer or a condition that could lead to cancer, then you no longer need Pap tests. However, even if you no longer need a Pap test, a regular exam is a good idea to make sure no other problems are starting. Document Released: 09/25/2000 Document Revised: 06/24/2011 Document Reviewed: 11/10/2010 [facility] Patient Information 2013 [place], [place].   Uterine Fibroid A uterine fibroid is a growth (tumor) that occurs in a woman's uterus. This type of tumor is not cancerous and does not spread out of the uterus. A woman can have one or many fibroids, and the fiboid(s) can become quite large. A fibroid can vary in size, weight, and where it grows in the uterus. Most fibroids do not require medical treatment, but some can  cause pain or heavy bleeding during and between periods. CAUSES  A fibroid is the result of a single uterine cell that keeps growing (unregulated), which is different than most cells in the human body. Most cells have a control mechanism that  keeps them from reproducing without control.  SYMPTOMS   Bleeding.  Pelvic pain and pressure.  Bladder problems due to the size of the fibroid.  Infertility and miscarriages depending on the size and location of the fibroid. DIAGNOSIS  A diagnosis is made by physical exam. Your caregiver may feel the lumpy tumors during a pelvic exam. Important information regarding size, location, and number of tumors can be gained by having an ultrasound. It is rare that other tests, such as a CT scan or MRI, are needed. TREATMENT   Your caregiver may recommend watchful waiting. This involves getting the fibroid checked by your caregiver to see if the fibroids grow or shrink.   Hormonal treatment or an intrauterine device (IUD) may be prescribed.   Surgery may be needed to remove the fibroids (myomectomy) or the uterus (hysterectomy). This depends on your situation. When fibroids interfere with fertility and a woman wants to become pregnant, a caregiver may recommend having the fibroids removed.  HOME CARE INSTRUCTIONS  Home care depends on how you were treated. In general:   Keep all follow-up appointments with your caregiver.   Only take medicine as told by your caregiver. Do not take aspirin. It can cause bleeding.   If you have excessive periods and soak tampons or pads in a half hour or less, contact your caregiver immediately. If your periods are troublesome but not so heavy, lie down with your feet raised slightly above your heart. Place cold packs on your lower abdomen.   If your periods are heavy, write down the number of pads or tampons you use per month. Bring this information to your caregiver.   Talk to your caregiver about taking iron pills.   Include green vegetables in your diet.   If you were prescribed a hormonal treatment, take the hormonal medicines as directed.   If you need surgery, ask your caregiver for information on your specific surgery.  SEEK IMMEDIATE  MEDICAL CARE IF:  You have pelvic pain or cramps not controlled with medicines.   You have a sudden increase in pelvic pain.   You have an increase of bleeding between and during periods.   You feel lightheaded or have fainting episodes.  MAKE SURE YOU:  Understand these instructions.  Will watch your condition.  Will get help right away if you are not doing well or get worse. Document Released: 03/29/2000 Document Revised: 06/24/2011 Document Reviewed: 04/22/2011 [facility] Patient Information 2013 [place], [place].

## 2012-05-20 ENCOUNTER — Ambulatory Visit (HOSPITAL_COMMUNITY)
Admission: RE | Admit: 2012-05-20 | Discharge: 2012-05-20 | Disposition: A | Discharge status: Home / Self Care | Payer: Self-pay | Source: Ambulatory Visit | Attending: Obstetrics and Gynecology | Admitting: Obstetrics and Gynecology

## 2012-05-20 DIAGNOSIS — N949 Unspecified condition associated with female genital organs and menstrual cycle: Secondary | ICD-10-CM | POA: Insufficient documentation

## 2012-05-20 DIAGNOSIS — D259 Leiomyoma of uterus, unspecified: Secondary | ICD-10-CM

## 2012-05-20 DIAGNOSIS — D252 Subserosal leiomyoma of uterus: Secondary | ICD-10-CM | POA: Insufficient documentation

## 2012-05-20 DIAGNOSIS — N938 Other specified abnormal uterine and vaginal bleeding: Secondary | ICD-10-CM | POA: Insufficient documentation

## 2012-05-20 DIAGNOSIS — D251 Intramural leiomyoma of uterus: Secondary | ICD-10-CM | POA: Insufficient documentation

## 2012-06-01 ENCOUNTER — Encounter: Payer: Self-pay | Admitting: Obstetrics and Gynecology

## 2012-06-01 ENCOUNTER — Ambulatory Visit: Payer: Self-pay | Admitting: Obstetrics and Gynecology

## 2012-06-01 VITALS — BP 144/87 | HR 100 | Ht 65.5 in | Wt 195.8 lb

## 2012-06-01 DIAGNOSIS — N938 Other specified abnormal uterine and vaginal bleeding: Secondary | ICD-10-CM

## 2012-06-01 DIAGNOSIS — D259 Leiomyoma of uterus, unspecified: Secondary | ICD-10-CM

## 2012-06-01 DIAGNOSIS — Z712 Person consulting for explanation of examination or test findings: Secondary | ICD-10-CM

## 2012-06-01 NOTE — Progress Notes (Signed)
Patient ID: Holly Valencia, female   DOB: December 17, 1975, 37 y.o.   MRN: [id number] 37 yo G2P1011 presenting today to discuss results of biopsy and ultrasound as well as surgical planning for management of menorrhagia secondary to fibroid uterus.  Results of the endometrial biospy which was benign secretory endometrium as well as ultrasound were reviewed and explained.  Uterus: Anteverted, anteflexed. Multiple fibroids again noted.  Overall measurement 14.8 x 14.1 x 7.2 cm (previously 14.4 x 12.5 x  9.2 cm). Dominant fibroid as follows:  Left uterine fundus, subserosal / intramural, 5.9 x 6.1 x 5.7 cm  (previously 5.3 x 4.9 x 4.3 cm)  Right uterine fundus/body, intramural, 7.1 x 5.8 x 5.5 cm  (previously 7.0 x 5.9 x 5.8 cm)  Uterine fundus, intramural, 4.2 x 4.2 x 4.0 cm  Left lower uterine segment subserosal / intramural, 3.7 x 3.9 x 3.3  cm (previously 2.1 x 2.1 x 2.1 cm)  Endometrium: 1.5 cm, borderline trilaminar in appearance with trace  fluid within the endometrium.  Right ovary: 3.5 x 1.8 x 1.7 cm. Normal.  Left ovary: 3.3 x 2.1 x 1.7 cm. Normal.  Other Findings: No free fluid  Patient desires to proceed with surgical intervention. Patient was counseled on hysterectomy with preservation of ovaries. Risks, benefits and alternatives were explained including but not limited to risks of bleeding, infection and damage to adjacent organs. Patient verbalized understanding and all questions were answered. Patient desires to have a supracervical hysterectomy with bilateral salpingectomy.

## 2012-06-02 ENCOUNTER — Encounter: Payer: Self-pay | Admitting: *Deleted

## 2012-06-08 ENCOUNTER — Encounter: Payer: Self-pay | Admitting: *Deleted

## 2012-06-10 ENCOUNTER — Ambulatory Visit: Payer: Self-pay | Admitting: Obstetrics and Gynecology

## 2012-06-15 ENCOUNTER — Inpatient Hospital Stay (HOSPITAL_COMMUNITY): Admission: RE | Admit: 2012-06-15 | Payer: Self-pay | Source: Ambulatory Visit | Admitting: Obstetrics and Gynecology

## 2012-06-15 ENCOUNTER — Encounter (HOSPITAL_COMMUNITY): Admission: RE | Payer: Self-pay | Source: Ambulatory Visit

## 2012-06-15 SURGERY — HYSTERECTOMY, SUPRACERVICAL, ABDOMINAL
Anesthesia: Choice | Site: Abdomen

## 2012-07-20 SURGERY — Surgical Case
Anesthesia: *Unknown

## 2012-08-24 ENCOUNTER — Encounter: Payer: Self-pay | Admitting: Obstetrics and Gynecology

## 2012-08-24 ENCOUNTER — Ambulatory Visit (INDEPENDENT_AMBULATORY_CARE_PROVIDER_SITE_OTHER): Payer: Self-pay | Admitting: Obstetrics and Gynecology

## 2012-08-24 VITALS — BP 129/80 | HR 103 | Ht 66.0 in | Wt 194.6 lb

## 2012-08-24 DIAGNOSIS — N938 Other specified abnormal uterine and vaginal bleeding: Secondary | ICD-10-CM

## 2012-08-24 DIAGNOSIS — N949 Unspecified condition associated with female genital organs and menstrual cycle: Secondary | ICD-10-CM

## 2012-08-24 DIAGNOSIS — D259 Leiomyoma of uterus, unspecified: Secondary | ICD-10-CM

## 2012-08-24 MED ORDER — FERROUS SULFATE 325 (65 FE) MG PO TABS: 325.0000 mg | ORAL_TABLET | Freq: Two times a day (BID) | ORAL | Status: DC

## 2012-08-24 MED ORDER — DOCUSATE SODIUM 100 MG PO CAPS: 100.0000 mg | ORAL_CAPSULE | Freq: Two times a day (BID) | ORAL | Status: DC | PRN

## 2012-08-24 NOTE — Progress Notes (Signed)
Patient ID: Holly Valencia, female   DOB: [birth year], 37 y.o.   MRN: [id number] 37 yo G2P1011 here to reschedule hysterectomy. Patient was last seen in February and was not able to have surgery done secondary to financial cost and desired to have it after her daughter's graduation. She is now ready to have it rescheduled following her daughter's graduation on 6/14. She reports persistent heavy cycles lasting 8-10 days. At times she does feel weak and is taking multivitamins with iron.  Reviewed risks and benefits of the procedure. All questions were answered. Patient will be schedule for supracervical hysterectomy with bilateral salpingectomy.

## 2012-08-27 ENCOUNTER — Telehealth: Payer: Self-pay | Admitting: *Deleted

## 2012-08-27 NOTE — Telephone Encounter (Signed)
Called [facility] [phone]- are closed for today- will need to call another day. If they do not have  Pap results need to be scheduled for one in our clinic

## 2012-08-27 NOTE — Telephone Encounter (Signed)
Message copied by Gerome Apley on Thu Aug 27, 2012  1:59 PM ------      Message from: CONSTANT, PEGGY      Created: Thu Aug 27, 2012  1:54 PM      Regarding: need pap smear result       Please call office to fax over pap smear result for the past year ------

## 2012-09-03 ENCOUNTER — Telehealth: Payer: Self-pay | Admitting: *Deleted

## 2012-09-03 ENCOUNTER — Encounter: Payer: Self-pay | Admitting: Obstetrics and Gynecology

## 2012-09-03 NOTE — Telephone Encounter (Signed)
Status: Signed            Called [facility] [phone]- are closed for today- will need to call another day. If they do not have Pap results need to be scheduled for one in our clinic        Simona Huh, RN at 08/27/2012 1:59 PM    Status: Signed             Message copied by Gerome Apley on Thu Aug 27, 2012 1:59 PM  ------  Message from: CONSTANT, PEGGY  Created: Thu Aug 27, 2012 1:54 PM  Regarding: need pap smear result  Please call office to fax over pap smear result for the past year  ------

## 2012-09-03 NOTE — Telephone Encounter (Signed)
Results received and given to Dr. Jolayne Panther for review

## 2012-09-03 NOTE — Telephone Encounter (Signed)
Called [facility], they state they did not receive the release, will resend release.

## 2012-09-03 NOTE — Progress Notes (Signed)
Patient ID: Holly Valencia, female   DOB: [birth year], 37 y.o.   MRN: [id number] Records [facility] which show a normal pap smear in 2010 but nothing since. Will have patient return for pap smear only prior to surgical intervention.

## 2012-10-08 ENCOUNTER — Encounter: Payer: Self-pay | Admitting: Advanced Practice Midwife

## 2012-10-08 ENCOUNTER — Ambulatory Visit (INDEPENDENT_AMBULATORY_CARE_PROVIDER_SITE_OTHER): Payer: Self-pay | Admitting: Advanced Practice Midwife

## 2012-10-08 VITALS — BP 145/85 | HR 102 | Temp 96.6°F | Ht 66.0 in | Wt 191.6 lb

## 2012-10-08 DIAGNOSIS — Z124 Encounter for screening for malignant neoplasm of cervix: Secondary | ICD-10-CM

## 2012-10-08 DIAGNOSIS — Z01419 Encounter for gynecological examination (general) (routine) without abnormal findings: Secondary | ICD-10-CM

## 2012-10-08 NOTE — Progress Notes (Signed)
  Subjective:     Holly Valencia is a 37 y.o. female and is here for a well-woman exam and Pap. The patient reports no problems.  She desires hysterectomy/salpingectomy and needs current Pap results prior to surgical consult.    History   Social History  . Marital Status: Single    Spouse Name: N/A    Number of Children: N/A  . Years of Education: N/A   Occupational History  . Not on file.   Social History Main Topics  . Smoking status: Never Smoker   . Smokeless tobacco: Never Used  . Alcohol Use: No     Comment: occasional  . Drug Use: No  . Sexually Active: Yes    Birth Control/ Protection: None   Other Topics Concern  . Not on file   Social History Narrative  . No narrative on file   Health Maintenance  Topic Date Due  . Pap Smear  05/02/1991  . Tetanus/tdap  05/01/1992  . Influenza Vaccine  12/14/2012    The following portions of the patient's history were reviewed and updated as appropriate: allergies, current medications, past family history, past medical history, past social history, past surgical history and problem list.  Review of Systems A comprehensive review of systems was negative.   Objective:    BP 145/85  Pulse 102  Temp(Src) 96.6 F (35.9 C) (Oral)  Ht 5\' 6"  (1.676 m)  Wt 86.909 kg (191 lb 9.6 oz)  BMI 30.94 kg/m2  LMP 09/24/2012 General appearance: alert, cooperative, appears stated age and no distress Neck: no adenopathy, no carotid bruit, no JVD, supple, symmetrical, trachea midline and thyroid not enlarged, symmetric, no tenderness/mass/nodules Lungs: clear to auscultation bilaterally Breasts: normal appearance, no masses or tenderness Heart: regular rate and rhythm, S1, S2 normal, no murmur, click, rub or gallop Abdomen: soft, non-tender; bowel sounds normal; no masses,  no organomegaly Pelvic: cervix normal in appearance, external genitalia normal, no adnexal masses or tenderness, no cervical motion tenderness, rectovaginal  septum normal, uterus normal size, shape, and consistency and vagina normal without discharge Extremities: extremities normal, atraumatic, no cyanosis or edema Skin: Skin color, texture, turgor normal. No rashes or lesions Neurologic: Alert and oriented X 3, normal strength and tone. Normal symmetric reflexes. Normal coordination and gait    Assessment:      1. Well woman exam with routine gynecological exam   2. Screening for malignant neoplasm of the cervix   3. Routine gynecological examination        Plan:     F/U with surgical consult appointment with MD F/U with well-woman visit annually, Pap per guidelines based on today's results

## 2012-10-20 ENCOUNTER — Encounter: Payer: Self-pay | Admitting: Obstetrics and Gynecology

## 2012-11-12 ENCOUNTER — Ambulatory Visit: Payer: Self-pay | Admitting: Obstetrics and Gynecology

## 2012-11-18 ENCOUNTER — Encounter (HOSPITAL_COMMUNITY): Payer: Self-pay

## 2012-11-18 ENCOUNTER — Ambulatory Visit (INDEPENDENT_AMBULATORY_CARE_PROVIDER_SITE_OTHER): Payer: Medicaid Other | Admitting: Obstetrics and Gynecology

## 2012-11-18 ENCOUNTER — Encounter: Payer: Self-pay | Admitting: Obstetrics and Gynecology

## 2012-11-18 ENCOUNTER — Encounter (HOSPITAL_COMMUNITY): Payer: Self-pay | Admitting: Pharmacist

## 2012-11-18 ENCOUNTER — Encounter (HOSPITAL_COMMUNITY)
Admission: RE | Admit: 2012-11-18 | Discharge: 2012-11-18 | Disposition: A | Discharge status: Home / Self Care | Payer: Medicaid Other | Source: Ambulatory Visit | Attending: Obstetrics and Gynecology | Admitting: Obstetrics and Gynecology

## 2012-11-18 VITALS — BP 151/100 | HR 94 | Temp 98.6°F | Ht 65.0 in | Wt 190.6 lb

## 2012-11-18 DIAGNOSIS — Z01812 Encounter for preprocedural laboratory examination: Secondary | ICD-10-CM | POA: Insufficient documentation

## 2012-11-18 DIAGNOSIS — Z124 Encounter for screening for malignant neoplasm of cervix: Secondary | ICD-10-CM

## 2012-11-18 DIAGNOSIS — Z01818 Encounter for other preprocedural examination: Secondary | ICD-10-CM | POA: Insufficient documentation

## 2012-11-18 HISTORY — DX: Cardiac murmur, unspecified: R01.1

## 2012-11-18 HISTORY — DX: Anemia, unspecified: D64.9

## 2012-11-18 HISTORY — DX: Headache: R51

## 2012-11-18 HISTORY — DX: Anxiety disorder, unspecified: F41.9

## 2012-11-18 LAB — CBC
HCT: 32 % — ABNORMAL LOW (ref 36.0–46.0)
Hemoglobin: 10.3 g/dL — ABNORMAL LOW (ref 12.0–15.0)
MCH: 21.4 pg — ABNORMAL LOW (ref 26.0–34.0)
MCV: 66.5 fL — ABNORMAL LOW (ref 78.0–100.0)
RBC: 4.81 MIL/uL (ref 3.87–5.11)
WBC: 5.2 10*3/uL (ref 4.0–10.5)

## 2012-11-18 NOTE — Progress Notes (Signed)
Patient ID: Holly Valencia, female   DOB: 14-Dec-1975, 37 y.o.   MRN: [id number] 37 yo G2P1011 presenting today for repeat pap smear, as the previous one indicated a poor sampling, in preparation for scheduled hysterectomy on 11/25/2012.  Pap smear collected  Questions regarding upcoming surgery answered.

## 2012-11-18 NOTE — Patient Instructions (Signed)
Your procedure is scheduled on:11/25/12  Enter through the Main Entrance at :1130 am Pick up desk phone and dial [phone] and inform us of your arrival.  Please call [phone] if you have any problems the morning of surgery.  Remember: Do not eat food after midnight:TUESDAY Clear liquids are ok until:9am WED   You may brush your teeth the morning of surgery.   DO NOT wear jewelry, eye make-up, lipstick,body lotion, or dark fingernail polish.  (Polished toes are ok) You may wear deodorant.  If you are to be admitted after surgery, leave suitcase in car until your room has been assigned. Patients discharged on the day of surgery will not be allowed to drive home. Wear loose fitting, comfortable clothes for your ride home.

## 2012-11-24 MED ORDER — CEFAZOLIN SODIUM-DEXTROSE 2-3 GM-% IV SOLR
2.0000 g | INTRAVENOUS | Status: AC
  Administered 2012-11-25: 2 g via INTRAVENOUS

## 2012-11-24 NOTE — H&P (Signed)
Holly Valencia is an 37 y.o. female G2P1011 presenting today for scheduled hysterectomy for definitive management of fibroid uterus and menorrhagia.  Pertinent Gynecological History: Menses: flow is excessive with use of 6 pads or tampons on heaviest days Bleeding: dysfunctional uterine bleeding Contraception: none DES exposure: denies Blood transfusions: none Sexually transmitted diseases: no past history Previous GYN Procedures: DNC  Last mammogram: n/a Last pap: normal Date: 11/2012 OB History: G2, P1011   Menstrual History: No LMP recorded.    Past Medical History  Diagnosis Date  . Fibroids   . Lupus     Of the Skin only  . Anemia   . Headache(784.0)   . Heart murmur     as a child  . Anxiety     no meds    Past Surgical History  Procedure Laterality Date  . No past surgeries      Family History  Problem Relation Age of Onset  . Diabetes Mother   . Hypertension Mother   . Hyperlipidemia Father   . Cancer Paternal Aunt   . Diabetes Maternal Grandmother   . Diabetes Paternal Grandmother     Social History:  reports that she has never smoked. She has never used smokeless tobacco. She reports that  drinks alcohol. She reports that she does not use illicit drugs.  Allergies:  Allergies  Allergen Reactions  . Latex Itching    Prescriptions prior to admission  Medication Sig Dispense Refill  . cholecalciferol (VITAMIN D) 1000 UNITS tablet Take 1,000 Units by mouth daily.      . clobetasol (TEMOVATE) 0.05 % external solution Apply topically daily as needed. For flare up      . ferrous sulfate (FERROUSUL) 325 (65 FE) MG tablet Take 1 tablet (325 mg total) by mouth 2 (two) times daily.  60 tablet  1  . Multiple Vitamins-Iron (MULTIVITAMINS WITH IRON) TABS Take 1 tablet by mouth daily.        Review of Systems  All other systems reviewed and are negative.    Blood pressure 142/92, temperature 98.6 F (37 C), resp. rate 18, SpO2 98.00%. Physical  Exam GENERAL: Well-developed, well-nourished female in no acute distress.  HEENT: Normocephalic, atraumatic. Sclerae anicteric.  NECK: Supple. Normal thyroid.  LUNGS: Clear to auscultation bilaterally.  HEART: Regular rate and rhythm. ABDOMEN: Soft, nontender, nondistended. PELVIC: Normal external female genitalia. 18-week size fibroid uterus. No adnexal mass or tenderness. EXTREMITIES: No cyanosis, clubbing, or edema, 2+ distal pulses.  Results for orders placed during the hospital encounter of 11/25/12 (from the past 24 hour(s))  PREGNANCY, URINE     Status: None   Collection Time    11/25/12 11:00 AM      Result Value Range   Preg Test, Ur NEGATIVE  NEGATIVE   05/2012 ultrasound Uterus: Anteverted, anteflexed. Multiple fibroids again noted.  Overall measurement 14.8 x 14.1 x 7.2 cm (previously 14.4 x 12.5 x  9.2 cm). Dominant fibroid as follows:  Left uterine fundus, subserosal / intramural, 5.9 x 6.1 x 5.7 cm  (previously 5.3 x 4.9 x 4.3 cm)  Right uterine fundus/body, intramural, 7.1 x 5.8 x 5.5 cm  (previously 7.0 x 5.9 x 5.8 cm)  Uterine fundus, intramural, 4.2 x 4.2 x 4.0 cm  Left lower uterine segment subserosal / intramural, 3.7 x 3.9 x 3.3  cm (previously 2.1 x 2.1 x 2.1 cm)  Endometrium: 1.5 cm, borderline trilaminar in appearance with trace  fluid within the endometrium.  Right ovary: 3.5 x  1.8 x 1.7 cm. Normal.  Left ovary: 3.3 x 2.1 x 1.7 cm. Normal.  Other Findings: No free fluid  05/2012- Benign endometrial biopsy  Assessment/Plan: 37 yo with menorrhagia secondary to fibroid uterus here for definitive treatment with hysterectomy -Risks, benefits and alternatives were explained to the patient including but not limited to risks of bleeding, infection, damage to adjacent organs. Patient verbalized understanding and all questions were answered. Patient wishes to proceed with a supracervical hysterectomy with bilateral salpingectomy.  Holly Valencia 11/25/2012, 11:47  AM

## 2012-11-25 ENCOUNTER — Encounter (HOSPITAL_COMMUNITY)
Admission: RE | Disposition: A | Discharge status: Home / Self Care | Payer: Self-pay | Source: Ambulatory Visit | Attending: Obstetrics and Gynecology

## 2012-11-25 ENCOUNTER — Encounter (HOSPITAL_COMMUNITY): Payer: Self-pay | Admitting: Anesthesiology

## 2012-11-25 ENCOUNTER — Observation Stay (HOSPITAL_COMMUNITY)
Admission: RE | Admit: 2012-11-25 | Discharge: 2012-11-27 | Disposition: A | Discharge status: Home / Self Care | Payer: Medicaid Other | Source: Ambulatory Visit | Attending: Obstetrics and Gynecology | Admitting: Obstetrics and Gynecology

## 2012-11-25 ENCOUNTER — Inpatient Hospital Stay (HOSPITAL_COMMUNITY): Payer: Medicaid Other | Admitting: Anesthesiology

## 2012-11-25 DIAGNOSIS — N920 Excessive and frequent menstruation with regular cycle: Principal | ICD-10-CM | POA: Insufficient documentation

## 2012-11-25 DIAGNOSIS — D250 Submucous leiomyoma of uterus: Secondary | ICD-10-CM | POA: Insufficient documentation

## 2012-11-25 DIAGNOSIS — N949 Unspecified condition associated with female genital organs and menstrual cycle: Secondary | ICD-10-CM

## 2012-11-25 DIAGNOSIS — D252 Subserosal leiomyoma of uterus: Secondary | ICD-10-CM | POA: Insufficient documentation

## 2012-11-25 DIAGNOSIS — D259 Leiomyoma of uterus, unspecified: Secondary | ICD-10-CM

## 2012-11-25 DIAGNOSIS — D251 Intramural leiomyoma of uterus: Secondary | ICD-10-CM | POA: Insufficient documentation

## 2012-11-25 DIAGNOSIS — N938 Other specified abnormal uterine and vaginal bleeding: Secondary | ICD-10-CM

## 2012-11-25 HISTORY — PX: SUPRACERVICAL ABDOMINAL HYSTERECTOMY: SHX5393

## 2012-11-25 LAB — PREGNANCY, URINE: Preg Test, Ur: NEGATIVE

## 2012-11-25 SURGERY — HYSTERECTOMY, SUPRACERVICAL, ABDOMINAL
Anesthesia: General | Site: Abdomen | Wound class: Clean Contaminated

## 2012-11-25 MED ORDER — KETOROLAC TROMETHAMINE 30 MG/ML IJ SOLN
INTRAMUSCULAR | Status: AC
  Filled 2012-11-25: qty 1

## 2012-11-25 MED ORDER — GLYCOPYRROLATE 0.2 MG/ML IJ SOLN
INTRAMUSCULAR | Status: DC | PRN
  Administered 2012-11-25: 0.6 mg via INTRAVENOUS

## 2012-11-25 MED ORDER — DIPHENHYDRAMINE HCL 12.5 MG/5ML PO ELIX: 12.5000 mg | ORAL_SOLUTION | Freq: Four times a day (QID) | ORAL | Status: DC | PRN

## 2012-11-25 MED ORDER — FERROUS SULFATE 325 (65 FE) MG PO TABS
325.0000 mg | ORAL_TABLET | Freq: Two times a day (BID) | ORAL | Status: DC
  Administered 2012-11-26 – 2012-11-27 (×3): 325 mg via ORAL
  Filled 2012-11-25 (×3): qty 1

## 2012-11-25 MED ORDER — ONDANSETRON HCL 4 MG/2ML IJ SOLN
INTRAMUSCULAR | Status: DC | PRN
  Administered 2012-11-25: 4 mg via INTRAVENOUS

## 2012-11-25 MED ORDER — MIDAZOLAM HCL 2 MG/2ML IJ SOLN: 0.5000 mg | Freq: Once | INTRAMUSCULAR | Status: DC | PRN

## 2012-11-25 MED ORDER — LIDOCAINE HCL (CARDIAC) 20 MG/ML IV SOLN
INTRAVENOUS | Status: DC | PRN
  Administered 2012-11-25: 30 mg via INTRAVENOUS
  Administered 2012-11-25: 70 mg via INTRAVENOUS

## 2012-11-25 MED ORDER — FENTANYL CITRATE 0.05 MG/ML IJ SOLN
25.0000 ug | INTRAMUSCULAR | Status: DC | PRN
  Administered 2012-11-25 (×4): 25 ug via INTRAVENOUS

## 2012-11-25 MED ORDER — GLYCOPYRROLATE 0.2 MG/ML IJ SOLN
INTRAMUSCULAR | Status: AC
  Filled 2012-11-25: qty 3

## 2012-11-25 MED ORDER — LACTATED RINGERS IV SOLN
INTRAVENOUS | Status: DC
  Administered 2012-11-25 – 2012-11-26 (×2): via INTRAVENOUS

## 2012-11-25 MED ORDER — SODIUM CHLORIDE 0.9 % IJ SOLN: 9.0000 mL | INTRAMUSCULAR | Status: DC | PRN

## 2012-11-25 MED ORDER — PROPOFOL 10 MG/ML IV BOLUS
INTRAVENOUS | Status: DC | PRN
  Administered 2012-11-25: 180 mg via INTRAVENOUS

## 2012-11-25 MED ORDER — KETOROLAC TROMETHAMINE 30 MG/ML IJ SOLN
INTRAMUSCULAR | Status: DC | PRN
  Administered 2012-11-25: 30 mg via INTRAVENOUS

## 2012-11-25 MED ORDER — FENTANYL CITRATE 0.05 MG/ML IJ SOLN
INTRAMUSCULAR | Status: AC
Start: 2012-11-25 — End: 2012-11-25
  Filled 2012-11-25: qty 5

## 2012-11-25 MED ORDER — NEOSTIGMINE METHYLSULFATE 1 MG/ML IJ SOLN
INTRAMUSCULAR | Status: AC
  Filled 2012-11-25: qty 1

## 2012-11-25 MED ORDER — HYDROMORPHONE HCL PF 1 MG/ML IJ SOLN
INTRAMUSCULAR | Status: DC | PRN
  Administered 2012-11-25: 1 mg via INTRAVENOUS

## 2012-11-25 MED ORDER — MEPERIDINE HCL 25 MG/ML IJ SOLN: 6.2500 mg | INTRAMUSCULAR | Status: DC | PRN

## 2012-11-25 MED ORDER — FENTANYL CITRATE 0.05 MG/ML IJ SOLN
INTRAMUSCULAR | Status: DC | PRN
  Administered 2012-11-25 (×5): 50 ug via INTRAVENOUS

## 2012-11-25 MED ORDER — PROMETHAZINE HCL 25 MG/ML IJ SOLN: 6.2500 mg | INTRAMUSCULAR | Status: DC | PRN

## 2012-11-25 MED ORDER — PROPOFOL 10 MG/ML IV EMUL
INTRAVENOUS | Status: AC
  Filled 2012-11-25: qty 20

## 2012-11-25 MED ORDER — NEOSTIGMINE METHYLSULFATE 1 MG/ML IJ SOLN
INTRAMUSCULAR | Status: DC | PRN
  Administered 2012-11-25: 3 mg via INTRAVENOUS

## 2012-11-25 MED ORDER — ONDANSETRON HCL 4 MG/2ML IJ SOLN: 4.0000 mg | Freq: Four times a day (QID) | INTRAMUSCULAR | Status: DC | PRN

## 2012-11-25 MED ORDER — ONDANSETRON HCL 4 MG/2ML IJ SOLN
INTRAMUSCULAR | Status: AC
  Filled 2012-11-25: qty 2

## 2012-11-25 MED ORDER — 0.9 % SODIUM CHLORIDE (POUR BTL) OPTIME
TOPICAL | Status: DC | PRN
  Administered 2012-11-25: 1000 mL

## 2012-11-25 MED ORDER — KETOROLAC TROMETHAMINE 30 MG/ML IJ SOLN: 15.0000 mg | Freq: Once | INTRAMUSCULAR | Status: DC | PRN

## 2012-11-25 MED ORDER — LIDOCAINE HCL (CARDIAC) 20 MG/ML IV SOLN
INTRAVENOUS | Status: AC
  Filled 2012-11-25: qty 5

## 2012-11-25 MED ORDER — BUPIVACAINE HCL (PF) 0.25 % IJ SOLN
INTRAMUSCULAR | Status: AC
  Filled 2012-11-25: qty 30

## 2012-11-25 MED ORDER — CEFAZOLIN SODIUM-DEXTROSE 2-3 GM-% IV SOLR
INTRAVENOUS | Status: AC
  Filled 2012-11-25: qty 50

## 2012-11-25 MED ORDER — FENTANYL CITRATE 0.05 MG/ML IJ SOLN
INTRAMUSCULAR | Status: AC
  Filled 2012-11-25: qty 2

## 2012-11-25 MED ORDER — NALOXONE HCL 0.4 MG/ML IJ SOLN: 0.4000 mg | INTRAMUSCULAR | Status: DC | PRN

## 2012-11-25 MED ORDER — DEXAMETHASONE SODIUM PHOSPHATE 10 MG/ML IJ SOLN
INTRAMUSCULAR | Status: DC | PRN
  Administered 2012-11-25: 10 mg via INTRAVENOUS

## 2012-11-25 MED ORDER — DIPHENHYDRAMINE HCL 50 MG/ML IJ SOLN: 12.5000 mg | Freq: Four times a day (QID) | INTRAMUSCULAR | Status: DC | PRN

## 2012-11-25 MED ORDER — DEXAMETHASONE SODIUM PHOSPHATE 10 MG/ML IJ SOLN
INTRAMUSCULAR | Status: AC
  Filled 2012-11-25: qty 1

## 2012-11-25 MED ORDER — MIDAZOLAM HCL 2 MG/2ML IJ SOLN
INTRAMUSCULAR | Status: AC
  Filled 2012-11-25: qty 2

## 2012-11-25 MED ORDER — LACTATED RINGERS IV SOLN
INTRAVENOUS | Status: DC
  Administered 2012-11-25 (×2): via INTRAVENOUS

## 2012-11-25 MED ORDER — ONDANSETRON HCL 4 MG/2ML IJ SOLN
4.0000 mg | Freq: Four times a day (QID) | INTRAMUSCULAR | Status: DC | PRN
  Administered 2012-11-25: 4 mg via INTRAVENOUS
  Filled 2012-11-25: qty 2

## 2012-11-25 MED ORDER — MIDAZOLAM HCL 5 MG/5ML IJ SOLN
INTRAMUSCULAR | Status: DC | PRN
  Administered 2012-11-25: 2 mg via INTRAVENOUS

## 2012-11-25 MED ORDER — MORPHINE SULFATE (PF) 1 MG/ML IV SOLN
INTRAVENOUS | Status: DC
Start: 2012-11-25 — End: 2012-11-26
  Administered 2012-11-25: 19:00:00 via INTRAVENOUS
  Administered 2012-11-25: 15 mg via INTRAVENOUS
  Administered 2012-11-25: 23:00:00 via INTRAVENOUS
  Administered 2012-11-26: 9 mg via INTRAVENOUS
  Administered 2012-11-26: 19.5 mg via INTRAVENOUS
  Filled 2012-11-25 (×2): qty 25

## 2012-11-25 MED ORDER — BUPIVACAINE HCL (PF) 0.25 % IJ SOLN
INTRAMUSCULAR | Status: DC | PRN
  Administered 2012-11-25: 28 mL

## 2012-11-25 MED ORDER — HYDROMORPHONE HCL PF 1 MG/ML IJ SOLN
INTRAMUSCULAR | Status: AC
  Filled 2012-11-25: qty 1

## 2012-11-25 MED ORDER — HYDROMORPHONE 0.3 MG/ML IV SOLN
INTRAVENOUS | Status: DC
  Filled 2012-11-25: qty 25

## 2012-11-25 MED ORDER — ROCURONIUM BROMIDE 100 MG/10ML IV SOLN
INTRAVENOUS | Status: DC | PRN
  Administered 2012-11-25: 10 mg via INTRAVENOUS
  Administered 2012-11-25: 40 mg via INTRAVENOUS

## 2012-11-25 SURGICAL SUPPLY — 31 items
CANISTER SUCTION 2500CC (MISCELLANEOUS) ×2 IMPLANT
CHLORAPREP W/TINT 26ML (MISCELLANEOUS) ×2 IMPLANT
CLOTH BEACON ORANGE TIMEOUT ST (SAFETY) ×2 IMPLANT
CONT PATH 16OZ SNAP LID 3702 (MISCELLANEOUS) ×2 IMPLANT
DECANTER SPIKE VIAL GLASS SM (MISCELLANEOUS) IMPLANT
DRSG OPSITE POSTOP 4X10 (GAUZE/BANDAGES/DRESSINGS) ×2 IMPLANT
GAUZE SPONGE 4X4 12PLY STRL LF (GAUZE/BANDAGES/DRESSINGS) ×2 IMPLANT
GAUZE SPONGE 4X4 16PLY XRAY LF (GAUZE/BANDAGES/DRESSINGS) ×2 IMPLANT
GLOVE BIOGEL PI IND STRL 6.5 (GLOVE) ×1 IMPLANT
GLOVE BIOGEL PI INDICATOR 6.5 (GLOVE) ×1
GLOVE SURG SS PI 6.0 STRL IVOR (GLOVE) ×2 IMPLANT
GOWN PREVENTION PLUS LG XLONG (DISPOSABLE) ×6 IMPLANT
NEEDLE HYPO 25X1 1.5 SAFETY (NEEDLE) ×2 IMPLANT
NS IRRIG 1000ML POUR BTL (IV SOLUTION) ×2 IMPLANT
PACK ABDOMINAL GYN (CUSTOM PROCEDURE TRAY) ×2 IMPLANT
PAD OB MATERNITY 4.3X12.25 (PERSONAL CARE ITEMS) ×2 IMPLANT
PROTECTOR NERVE ULNAR (MISCELLANEOUS) ×2 IMPLANT
SEPRAFILM MEMBRANE 5X6 (MISCELLANEOUS) IMPLANT
SPONGE LAP 18X18 X RAY DECT (DISPOSABLE) IMPLANT
STAPLER VISISTAT 35W (STAPLE) IMPLANT
SUT PDS AB 1 CT1 36 (SUTURE) IMPLANT
SUT VIC AB 0 CT1 18XCR BRD8 (SUTURE) ×3 IMPLANT
SUT VIC AB 0 CT1 36 (SUTURE) ×8 IMPLANT
SUT VIC AB 0 CT1 8-18 (SUTURE) ×3
SUT VIC AB 3-0 FS2 27 (SUTURE) IMPLANT
SUT VIC AB 4-0 KS 27 (SUTURE) ×2 IMPLANT
SUT VICRYL 0 TIES 12 18 (SUTURE) ×2 IMPLANT
SYR CONTROL 10ML LL (SYRINGE) ×2 IMPLANT
TOWEL OR 17X24 6PK STRL BLUE (TOWEL DISPOSABLE) ×4 IMPLANT
TRAY FOLEY CATH 14FR (SET/KITS/TRAYS/PACK) ×2 IMPLANT
WATER STERILE IRR 1000ML POUR (IV SOLUTION) ×2 IMPLANT

## 2012-11-25 NOTE — Op Note (Addendum)
Holly Valencia PROCEDURE DATE: 11/25/2012  PREOPERATIVE DIAGNOSIS:  37 y.o. G2P1011 with symptomatic fibroids, menorrhagia POSTOPERATIVE DIAGNOSIS:  Same SURGEON:   Holly Valencia, M.D. ASSISTANT:   Holly Valencia, M.D. OPERATION:  Supracervical abdominal hysterectomy, Bilateral Salpingectomy ANESTHESIA:  General endotracheal.  INDICATIONS: The patient is a 37 y.o. G2P1011 with history of symptomatic uterine fibroids/menorrhagia. The patient made a decision to undergo definite surgical treatment. On the preoperative visit, the risks, benefits, indications, and alternatives of the procedure were reviewed with the patient.  On the day of surgery, the risks of surgery were again discussed with the patient including but not limited to: bleeding which may require transfusion or reoperation; infection which may require antibiotics; injury to bowel, bladder, ureters or other surrounding organs; need for additional procedures; thromboembolic phenomenon, incisional problems and other postoperative/anesthesia complications. Written informed consent was obtained.    OPERATIVE FINDINGS: A 18-week size fibroid uterus with normal tubes and ovaries bilaterally. Gross weight 820 gm  ESTIMATED BLOOD LOSS: 150 ml FLUIDS:  1600 ml of Lactated Ringers URINE OUTPUT:  600 ml of clear yellow urine. SPECIMENS:  Uterus and bilateral tubes sent to pathology COMPLICATIONS:  None immediate.   DESCRIPTION OF PROCEDURE: The patient received intravenous antibiotics and had sequential compression devices applied to her lower extremities while in the preoperative area.   She was taken to the operating room where anesthesia was induced and found to be adequate. The patient was placed in supine position. The abdomen and perineum were prepped and draped in a sterile manner, and a Foley catheter was inserted into the bladder and attached to Holly Valencia drainage. After an adequate timeout was performed, a Pfannensteil skin incision  was made. This incision was taken down to the fascia using electrocautery with care given to maintain good hemostasis. The fascia was incised in the midline and the fascial incision was then extended bilaterally using mayo scissors. The fascia was then dissected off the underlying rectus muscles using blunt and sharp dissection. The rectus muscles were split bluntly in the midline and the peritoneum entered sharply without complication. This peritoneal incision was then extended superiorly and inferiorly with care given to prevent bowel or bladder injury. Upon entry into the abdominal cavity, the upper abdomen was inspected and found to be normal. Attention was then turned to the pelvis. A retractor was placed into the incision, and the bowel was packed away with moist laparotomy sponges. The uterus at this point was noted to be mobilized. The round ligaments on each side were clamped, suture ligated, and transected with electrocautery allowing entry into the broad ligament. The anterior and posterior leaves of the broad ligament were separated and the ureters were inspected to be safely away from the area of dissection bilaterally. A hole was created in the clear portion of the posterior broad ligament. Adnexae were clamped on the patient's right side. This pedicle was then clamped, cut, and doubly suture ligated with good hemostasis. This procedure was repeated in an identical fashion on the left site allowing for both ovaries to remain in place.  A bladder flap was then created across the anterior leaf of the broad ligament and the bladder was then bluntly dissected off the lower uterine segment and cervix with good hemostasis. The uterine arteries were then skeletonized bilaterally and then clamped, cut, and ligated with care given to prevent ureteral injury. The uterus was then amputated across the lower uterine segment leaving the cervix intact. The specimen was sent to pathology. The cervical canal was  coagulated, and the anterior and posterior peritoneal edges were then reapproximated in the midline over the cervical stump without complication. The pelvis was irrigated and hemostasis was reconfirmed at all pedicles and along the pelvic sidewall.  All laparotomy sponges and instruments were removed from the abdomen. The fascia was closed with 0 Vicryl in a running fashion. The skin was closed with a 4-0 Vicryl subcuticular stitch. 0.25% Marcaine was injected in the subcutaneous layer. Sponge, lap, needle, and instrument counts were correct times two. The patient was taken to the recovery area awake, extubated and in stable condition.

## 2012-11-25 NOTE — Transfer of Care (Signed)
Immediate Anesthesia Transfer of Care Note  Patient: Holly Valencia  Procedure(s) Performed: Procedure(s): HYSTERECTOMY SUPRACERVICAL ABDOMINAL (N/A)  Patient Location: PACU  Anesthesia Type:General  Level of Consciousness: awake, alert , sedated and patient cooperative  Airway & Oxygen Therapy: Patient Spontanous Breathing and Patient connected to nasal cannula oxygen  Post-op Assessment: Report given to PACU RN and Post -op Vital signs reviewed and stable  Post vital signs: Reviewed and stable  Complications: No apparent anesthesia complications

## 2012-11-25 NOTE — Anesthesia Postprocedure Evaluation (Signed)
  Anesthesia Post Note  Patient: Holly Valencia  Procedure(s) Performed: Procedure(s) (LRB): HYSTERECTOMY SUPRACERVICAL ABDOMINAL (N/A)  Anesthesia type: GA  Patient location: PACU  Post pain: Pain level controlled  Post assessment: Post-op Vital signs reviewed  Last Vitals:  Filed Vitals:   11/25/12 1345  BP: 145/91  Pulse: 93  Temp:   Resp: 12    Post vital signs: Reviewed  Level of consciousness: sedated  Complications: No apparent anesthesia complications

## 2012-11-25 NOTE — Anesthesia Preprocedure Evaluation (Signed)
Anesthesia Evaluation  Patient identified by MRN, date of birth, ID band Patient awake    Reviewed: Allergy & Precautions, H&P , Patient's Chart, lab work & pertinent test results  Airway Mallampati: II TM Distance: >3 FB Neck ROM: full    Dental no notable dental hx.    Pulmonary neg pulmonary ROS,  breath sounds clear to auscultation  Pulmonary exam normal       Cardiovascular negative cardio ROS  + Valvular Problems/Murmurs Rhythm:regular Rate:Normal     Neuro/Psych  Headaches, PSYCHIATRIC DISORDERS Anxiety negative neurological ROS  negative psych ROS   GI/Hepatic negative GI ROS, Neg liver ROS,   Endo/Other  negative endocrine ROS  Renal/GU negative Renal ROS     Musculoskeletal   Abdominal   Peds  Hematology negative hematology ROS (+) anemia ,   Anesthesia Other Findings Fibroids     Lupus   Of the Skin only    Anemia     Headache(784.0)        Heart murmur   as a child Anxiety   no meds    Reproductive/Obstetrics                           Anesthesia Physical Anesthesia Plan  ASA: II  Anesthesia Plan: General ETT   Post-op Pain Management:    Induction:   Airway Management Planned:   Additional Equipment:   Intra-op Plan:   Post-operative Plan:   Informed Consent: I have reviewed the patients History and Physical, chart, labs and discussed the procedure including the risks, benefits and alternatives for the proposed anesthesia with the patient or authorized representative who has indicated his/her understanding and acceptance.     Plan Discussed with:   Anesthesia Plan Comments:         Anesthesia Quick Evaluation

## 2012-11-26 ENCOUNTER — Encounter (HOSPITAL_COMMUNITY): Payer: Self-pay | Admitting: Obstetrics and Gynecology

## 2012-11-26 LAB — CBC
HCT: 28.6 % — ABNORMAL LOW (ref 36.0–46.0)
MCV: 66.1 fL — ABNORMAL LOW (ref 78.0–100.0)
RBC: 4.33 MIL/uL (ref 3.87–5.11)
WBC: 8 10*3/uL (ref 4.0–10.5)

## 2012-11-26 MED ORDER — IBUPROFEN 600 MG PO TABS
600.0000 mg | ORAL_TABLET | Freq: Four times a day (QID) | ORAL | Status: DC | PRN
  Administered 2012-11-26 – 2012-11-27 (×5): 600 mg via ORAL
  Filled 2012-11-26 (×5): qty 1

## 2012-11-26 MED ORDER — ONDANSETRON HCL 4 MG/2ML IJ SOLN
4.0000 mg | Freq: Four times a day (QID) | INTRAMUSCULAR | Status: DC | PRN
  Administered 2012-11-26: 4 mg via INTRAVENOUS
  Filled 2012-11-26: qty 2

## 2012-11-26 MED ORDER — OXYCODONE-ACETAMINOPHEN 5-325 MG PO TABS
1.0000 | ORAL_TABLET | ORAL | Status: DC | PRN
  Administered 2012-11-26 – 2012-11-27 (×4): 2 via ORAL
  Filled 2012-11-26 (×5): qty 2

## 2012-11-26 MED ORDER — ONDANSETRON HCL 4 MG PO TABS: 4.0000 mg | ORAL_TABLET | Freq: Three times a day (TID) | ORAL | Status: DC | PRN

## 2012-11-26 MED ORDER — LACTATED RINGERS IV BOLUS (SEPSIS): 500.0000 mL | Freq: Once | INTRAVENOUS | Status: DC

## 2012-11-26 MED ORDER — DOCUSATE SODIUM 100 MG PO CAPS
100.0000 mg | ORAL_CAPSULE | Freq: Two times a day (BID) | ORAL | Status: DC | PRN
  Administered 2012-11-26: 100 mg via ORAL
  Filled 2012-11-26: qty 1

## 2012-11-26 NOTE — Anesthesia Postprocedure Evaluation (Signed)
  Anesthesia Post-op Note  Patient: Holly Valencia  Procedure(s) Performed: Procedure(s): HYSTERECTOMY SUPRACERVICAL ABDOMINAL (N/A)  Patient Location: [facility]  Anesthesia Type:General  Level of Consciousness: awake, alert  and oriented  Airway and Oxygen Therapy: Patient Spontanous Breathing  Post-op Pain: mild  Post-op Assessment: Post-op Vital signs reviewed and Patient's Cardiovascular Status Stable  Post-op Vital Signs: Reviewed and stable  Complications: No apparent anesthesia complications

## 2012-11-26 NOTE — Progress Notes (Signed)
1 Day Post-Op Procedure(s) (LRB): HYSTERECTOMY SUPRACERVICAL ABDOMINAL (N/A)  Subjective: Patient reports incisional pain and tolerating PO.  She denies chest pain, SOB, lightheadedness/dizziness, nausea or emesis  Objective: I have reviewed patient's vital signs, intake and output and labs.  General: alert, cooperative and no distress Resp: clear to auscultation bilaterally Cardio: regular rate and rhythm GI: incision: clean, dry and intact and soft, appropriately tender, nondistended Extremities: extremities normal, atraumatic, no cyanosis or edema  Assessment: s/p Procedure(s): HYSTERECTOMY SUPRACERVICAL ABDOMINAL (N/A): progressing well and tolerating diet  Plan: Advance diet Encourage ambulation Advance to PO medication Discontinue IV fluids  LOS: 1 day    Lasonia Casino 11/26/2012, 8:14 AM

## 2012-11-27 MED ORDER — IBUPROFEN 600 MG PO TABS: 600.0000 mg | ORAL_TABLET | Freq: Four times a day (QID) | ORAL | Status: DC | PRN

## 2012-11-27 MED ORDER — OXYCODONE-ACETAMINOPHEN 5-325 MG PO TABS: 1.0000 | ORAL_TABLET | ORAL | Status: DC | PRN

## 2012-11-27 MED ORDER — DSS 100 MG PO CAPS: 100.0000 mg | ORAL_CAPSULE | Freq: Two times a day (BID) | ORAL | Status: DC

## 2012-11-27 NOTE — Discharge Summary (Signed)
Physician Discharge Summary  Patient ID: Holly Valencia MRN: [id number] DOB/AGE: [birth year] 39 y.o.  Admit date: 11/25/2012 Discharge date: 11/27/2012  Admission Diagnoses: fibroid uterus and menorrhagia  Discharge Diagnoses: same, s/p supracervical hysterectomy and bilateral salpingectomy Active Problems:   * No active hospital problems. *   Discharged Condition: good  Hospital Course: Patient admitted on 11/25/2012 for scheduled supracervical hysterectomy and bilateral salpingectomy where an 800 gm grossly normal uterus and bilateral tubes were removed. Patient had an uncomplicated post-operative course. She was able to tolerate po intake, void, pass flatus and ambulate without difficulty. Her vital signs remained stable and she remained afebrile. Patient found to be stable for discharge on POD#2. Discharge instructions were provided along with a post-op follow up appointment.  Consults: None  Significant Diagnostic Studies: labs: pre-op Hg 10.5--> POD#1 Hg 9.5  Treatments: surgery: Supracervical hysterectomy and bilateral salpingectomy  Discharge Exam: Blood pressure 128/71, pulse 71, temperature 98.1 F (36.7 C), temperature source Oral, resp. rate 18, height 5\' 5"  (1.651 m), weight 190 lb (86.183 kg), SpO2 100.00%. General appearance: alert, cooperative and no distress Resp: clear to auscultation bilaterally GI: soft, non-tender; bowel sounds normal; no masses,  no organomegaly Extremities: Homans sign is negative, no sign of DVT Incision/Wound: no erythema, induration or drainage. Honeycomb dressing to be reapplied prior to discharge  Disposition: 01-Home or Self Care   Future Appointments Provider Department Dept Phone   12/31/2012 12:45 PM Catalina Antigua, MD [facility] [phone]       Medication List         cholecalciferol 1000 UNITS tablet  Commonly known as:  VITAMIN D  Take 1,000 Units by mouth daily.     clobetasol 0.05 % external solution   Commonly known as:  TEMOVATE  Apply topically daily as needed. For flare up     DSS 100 MG Caps  Take 100 mg by mouth 2 (two) times daily.     ferrous sulfate 325 (65 FE) MG tablet  Commonly known as:  FERROUSUL  Take 1 tablet (325 mg total) by mouth 2 (two) times daily.     ibuprofen 600 MG tablet  Commonly known as:  ADVIL,MOTRIN  Take 1 tablet (600 mg total) by mouth every 6 (six) hours as needed.     multivitamins with iron Tabs tablet  Take 1 tablet by mouth daily.     oxyCODONE-acetaminophen 5-325 MG per tablet  Commonly known as:  PERCOCET/ROXICET  Take 1-2 tablets by mouth every 4 (four) hours as needed.         Signed: Bhumi Godbey 11/27/2012, 8:17 AM

## 2012-11-27 NOTE — Progress Notes (Signed)
Pt was escorted out in a wheelchair.  Teaching is completed, all questions answered.  Pt called to come pick up her prescription, it was left at the hospital.

## 2012-12-03 ENCOUNTER — Telehealth: Payer: Self-pay | Admitting: *Deleted

## 2012-12-03 NOTE — Telephone Encounter (Signed)
Holly Valencia called and reports she had a hysterectomy last Wednesday with Dr. Jolayne Panther and is having some discomfort/burning in my womb area and wants to know what to wash it with or put on it.

## 2012-12-09 NOTE — Telephone Encounter (Signed)
[employer] and she states she called and spoke to someone in the ER and she doesn't have any questions or concerns now.

## 2012-12-15 ENCOUNTER — Encounter: Payer: Self-pay | Admitting: *Deleted

## 2012-12-31 ENCOUNTER — Ambulatory Visit (INDEPENDENT_AMBULATORY_CARE_PROVIDER_SITE_OTHER): Payer: Medicaid Other | Admitting: Obstetrics and Gynecology

## 2012-12-31 ENCOUNTER — Encounter: Payer: Self-pay | Admitting: Obstetrics and Gynecology

## 2012-12-31 VITALS — BP 125/83 | HR 95 | Temp 98.8°F | Ht 65.5 in | Wt 192.4 lb

## 2012-12-31 DIAGNOSIS — Z09 Encounter for follow-up examination after completed treatment for conditions other than malignant neoplasm: Secondary | ICD-10-CM

## 2012-12-31 NOTE — Progress Notes (Signed)
Here for post op. Reports problems with constipation and has reduced taking iron to  About 1 every other day. Also reports nausea for past 2 weeks and it seems to be getting worse.

## 2012-12-31 NOTE — Progress Notes (Signed)
Patient ID: Holly Valencia, female   DOB: 04/20/1975, 37 y.o.   MRN: [id number] 37 yo G2P1011 s/p SCH/bilateral salpingectomy on 8/13 presenting today for post op check. Patient has been doing well since her discharge from the hospital. She states that still suffers from constipation but admits to not consuming a lot of fiber. Patient is otherwise doing well.  Past Medical History  Diagnosis Date  . Fibroids   . Lupus     Of the Skin only  . Anemia   . Headache(784.0)   . Heart murmur     as a child  . Anxiety     no meds   Past Surgical History  Procedure Laterality Date  . No past surgeries    . Supracervical abdominal hysterectomy N/A 11/25/2012    Procedure: HYSTERECTOMY SUPRACERVICAL ABDOMINAL;  Surgeon: Catalina Antigua, MD;  Location: [facility];  Service: Gynecology;  Laterality: N/A;   Family History  Problem Relation Age of Onset  . Diabetes Mother   . Hypertension Mother   . Hyperlipidemia Father   . Cancer Paternal Aunt   . Diabetes Maternal Grandmother   . Diabetes Paternal Grandmother    History  Substance Use Topics  . Smoking status: Never Smoker   . Smokeless tobacco: Never Used  . Alcohol Use: Yes     Comment: occasional   GENERAL: Well-developed, well-nourished female in no acute distress.  HEENT: Normocephalic, atraumatic. Sclerae anicteric.  NECK: Supple. Normal thyroid.  LUNGS: Clear to auscultation bilaterally.  HEART: Regular rate and rhythm. BREASTS: Symmetric in size. No palpable masses or lymphadenopathy, skin changes, or nipple drainage. ABDOMEN: Soft, nontender, nondistended.  Incision: healed well, no erythema, induration or drainage PELVIC: Normal external female genitalia. Vagina is pink and rugated.  Normal discharge. Normal appearing cervix.  No adnexal mass or tenderness. EXTREMITIES: No cyanosis, clubbing, or edema, 2+ distal pulses.  A/P 37 yo s/p [facility] on 11/25/2012 here for post-op check - Patient doing very well - Patient allowed to  stay off work for another 2 week - Patient due for physical next august -RTC prn

## 2013-01-01 ENCOUNTER — Telehealth: Payer: Self-pay | Admitting: General Practice

## 2013-01-01 DIAGNOSIS — R112 Nausea with vomiting, unspecified: Secondary | ICD-10-CM

## 2013-01-01 MED ORDER — PROMETHAZINE HCL 25 MG PO TABS: 25.0000 mg | ORAL_TABLET | Freq: Four times a day (QID) | ORAL | Status: DC | PRN

## 2013-01-01 NOTE — Telephone Encounter (Signed)
Patient called and left message stating she was seen in the office yesterday by Dr Jolayne Panther for her post op hysterectomy visit and she has been experiencing a lot of nausea lately and has been up all night with it and wants to know if we can call something in. Per Hilda Lias williams can send phenergan 25mg  po q 6 hr prn for nausea disp 30 no refills. Called patient and informed her of medication available for pickup. Patient verbalized understanding and had no further questions

## 2013-01-15 ENCOUNTER — Encounter: Payer: Self-pay | Admitting: Obstetrics and Gynecology

## 2013-02-10 ENCOUNTER — Telehealth: Payer: Self-pay | Admitting: *Deleted

## 2013-02-10 NOTE — Telephone Encounter (Signed)
Holly Valencia called and left a message she has returned to work after hysterectomy and was prescribed tylox. States now that back to work and standing a lot and tylox is not " doing it". Wants to know if she can have another med.

## 2013-02-10 NOTE — Telephone Encounter (Signed)
Patient had hysterectomy in August. She should no longer require any type of narcotic for post op pain. She should follow up with PCP for evaluation of source of pain or is welcome to schedule appointment with our office.

## 2013-02-11 NOTE — Telephone Encounter (Signed)
Called patient and informed her that she should be evaluated by her PCP for her source of pain now as she should no longer be needing any pain medication for post-procedural pain. Pt. States she has had lower back pain from standing 4 hours at a time at work. Reinforced that pt. Should make a follow up appointment with either [place] or PCP to evaluate cause of pain. Pt. Verbalizes understanding and is satisfied; pt. Will follow up with PCP. Pt. Stated she had no other questions.

## 2013-12-18 ENCOUNTER — Encounter (HOSPITAL_BASED_OUTPATIENT_CLINIC_OR_DEPARTMENT_OTHER): Payer: Self-pay | Admitting: Emergency Medicine

## 2013-12-18 ENCOUNTER — Emergency Department (HOSPITAL_BASED_OUTPATIENT_CLINIC_OR_DEPARTMENT_OTHER)
Admission: EM | Admit: 2013-12-18 | Discharge: 2013-12-18 | Disposition: A | Discharge status: Home / Self Care | Payer: Self-pay | Attending: Emergency Medicine | Admitting: Emergency Medicine

## 2013-12-18 DIAGNOSIS — Z91040 Latex allergy status: Secondary | ICD-10-CM | POA: Insufficient documentation

## 2013-12-18 DIAGNOSIS — R011 Cardiac murmur, unspecified: Secondary | ICD-10-CM | POA: Insufficient documentation

## 2013-12-18 DIAGNOSIS — Z8742 Personal history of other diseases of the female genital tract: Secondary | ICD-10-CM | POA: Insufficient documentation

## 2013-12-18 DIAGNOSIS — Z862 Personal history of diseases of the blood and blood-forming organs and certain disorders involving the immune mechanism: Secondary | ICD-10-CM | POA: Insufficient documentation

## 2013-12-18 DIAGNOSIS — J0140 Acute pansinusitis, unspecified: Secondary | ICD-10-CM

## 2013-12-18 DIAGNOSIS — Z8659 Personal history of other mental and behavioral disorders: Secondary | ICD-10-CM | POA: Insufficient documentation

## 2013-12-18 DIAGNOSIS — J018 Other acute sinusitis: Secondary | ICD-10-CM | POA: Insufficient documentation

## 2013-12-18 DIAGNOSIS — M329 Systemic lupus erythematosus, unspecified: Secondary | ICD-10-CM | POA: Insufficient documentation

## 2013-12-18 DIAGNOSIS — Z79899 Other long term (current) drug therapy: Secondary | ICD-10-CM | POA: Insufficient documentation

## 2013-12-18 DIAGNOSIS — R51 Headache: Secondary | ICD-10-CM | POA: Insufficient documentation

## 2013-12-18 MED ORDER — FLUCONAZOLE 150 MG PO TABS: 150.0000 mg | ORAL_TABLET | Freq: Once | ORAL | Status: DC

## 2013-12-18 MED ORDER — SULFAMETHOXAZOLE-TRIMETHOPRIM 800-160 MG PO TABS: 1.0000 | ORAL_TABLET | Freq: Two times a day (BID) | ORAL | Status: DC

## 2013-12-18 NOTE — ED Notes (Addendum)
Patient here with complaint of facial and neck pain x 4 weeks. Patient thinks it is all related to her sinuses. Taking otc meds with minimal relief. Using nasonex, mucinex, claritin and decongestant. Denies cough, denies runny nose or sore throat

## 2013-12-18 NOTE — ED Provider Notes (Addendum)
CSN: [id number]     Arrival date & time 12/18/13  1601 History   First MD Initiated Contact with Patient 12/18/13 1032     Chief Complaint  Patient presents with  . Facial Pain     (Consider location/radiation/quality/duration/timing/severity/associated sxs/prior Treatment) HPI Complains of congestion in her face at maxillary area bilaterally and frontal area for the past 4 weeks, accompanied by a vague discomfort at her posterior neck. She's been treated herself with saline nasal spray, Mucinex Flonase and Claritin with partial relief. She denies any fever. Nothing makes symptoms worse. Symptoms improved somewhat with of saline nasal spray Flonase Claritin and Mucinex. No other associated symptoms. No shortness of breath no nausea or vomiting. No cough Past Medical History  Diagnosis Date  . Fibroids   . Lupus     Of the Skin only  . Anemia   . Headache(784.0)   . Heart murmur     as a child  . Anxiety     no meds   Past Surgical History  Procedure Laterality Date  . No past surgeries    . Supracervical abdominal hysterectomy N/A 11/25/2012    Procedure: HYSTERECTOMY SUPRACERVICAL ABDOMINAL;  Surgeon: Mora Bellman, MD;  Location: [facility];  Service: Gynecology;  Laterality: N/A;   Family History  Problem Relation Age of Onset  . Diabetes Mother   . Hypertension Mother   . Hyperlipidemia Father   . Cancer Paternal Aunt   . Diabetes Maternal Grandmother   . Diabetes Paternal Grandmother    History  Substance Use Topics  . Smoking status: Never Smoker   . Smokeless tobacco: Never Used  . Alcohol Use: Yes     Comment: occasional   OB History   Grav Para Term Preterm Abortions TAB SAB Ect Mult Living   2 1 1  0 1 1 0 0 0 1     Review of Systems  Constitutional: Negative.   HENT: Positive for congestion and sinus pressure.   Respiratory: Negative.   Cardiovascular: Negative.   Gastrointestinal: Negative.   Musculoskeletal: Negative.   Skin: Negative.   Neurological:  Negative.   Psychiatric/Behavioral: Negative.   All other systems reviewed and are negative.     Allergies  Latex  Home Medications   Prior to Admission medications   Medication Sig Start Date End Date Taking? Authorizing Provider  cholecalciferol (VITAMIN D) 1000 UNITS tablet Take 1,000 Units by mouth daily.    Historical Provider, MD  clobetasol (TEMOVATE) 0.05 % external solution Apply topically daily as needed. For flare up    Historical Provider, MD  Multiple Vitamins-Iron (MULTIVITAMINS WITH IRON) TABS Take 1 tablet by mouth daily.    Historical Provider, MD   BP 153/88  Pulse 85  Temp(Src) 98.2 F (36.8 C) (Oral)  Resp 20  Ht 5\' 6"  (1.676 m)  Wt 190 lb (86.183 kg)  BMI 30.68 kg/m2  SpO2 99%  LMP 11/12/2012 Physical Exam  Nursing note and vitals reviewed. Constitutional: She is oriented to person, place, and time. She appears well-developed and well-nourished. No distress.  HENT:  Head: Normocephalic and atraumatic.  Right Ear: External ear normal.  Left Ear: External ear normal.  Nose: Nose normal.  Bilateral tympanic membranes normal  Eyes: Conjunctivae are normal. Pupils are equal, round, and reactive to light.  Neck: Neck supple. No tracheal deviation present. No thyromegaly present.  Cardiovascular: Normal rate and regular rhythm.   No murmur heard. Pulmonary/Chest: Effort normal and breath sounds normal.  Abdominal: Soft. Bowel  sounds are normal. She exhibits no distension. There is no tenderness.  Musculoskeletal: Normal range of motion. She exhibits no edema and no tenderness.  Neurological: She is alert and oriented to person, place, and time. No cranial nerve deficit. Coordination normal.  Skin: Skin is warm and dry. No rash noted.  Psychiatric: She has a normal mood and affect.    ED Course  Procedures (including critical care time) Labs Review Labs Reviewed - No data to display  Imaging Review No results found.   EKG Interpretation None       MDM  Final diagnoses:  None   In light of symptoms lasting for weeks we'll treat with antibiotic. Plan prescription Bactrim DS. ENT referral as needed 10 days. Blood pressure recheck 3 weeks. We'll refer to resource guide to get primary care physician Diagnosis #1 sinusitis #2 elevated blood pressure     Orlie Dakin, MD 12/18/13 [place], MD 12/18/13 1106 Addendum patient states she gets yeast infections with antibiotics,. Prescription for Diflucan written  Orlie Dakin, MD 12/18/13 [phone]

## 2013-12-18 NOTE — Discharge Instructions (Signed)
Sinusitis Called Dr. Erik Obey to schedule an office visit if you're not feeling better in 10 days. You can continue with the Flonase, saline nasal spray, Claritin and Mucinex as directed. Get your blood pressure rechecked within the next 3 weeks. Today's was mildly elevated at 135/91. You can call any of the numbers on the resource guide to get a primary care physician. Sinusitis is redness, soreness, and puffiness (inflammation) of the air pockets in the bones of your face (sinuses). The redness, soreness, and puffiness can cause air and mucus to get trapped in your sinuses. This can allow germs to grow and cause an infection.  HOME CARE   Drink enough fluids to keep your pee (urine) clear or pale yellow.  Use a humidifier in your home.  Run a hot shower to create steam in the bathroom. Sit in the bathroom with the door closed. Breathe in the steam 3-4 times a day.  Put a warm, moist washcloth on your face 3-4 times a day, or as told by your doctor.  Use salt water sprays (saline sprays) to wet the thick fluid in your nose. This can help the sinuses drain.  Only take medicine as told by your doctor. GET HELP RIGHT AWAY IF:   Your pain gets worse.  You have very bad headaches.  You are sick to your stomach (nauseous).  You throw up (vomit).  You are very sleepy (drowsy) all the time.  Your face is puffy (swollen).  Your vision changes.  You have a stiff neck.  You have trouble breathing. MAKE SURE YOU:   Understand these instructions.  Will watch your condition.  Will get help right away if you are not doing well or get worse. Document Released: 09/18/2007 Document Revised: 12/25/2011 Document Reviewed: 11/05/2011 [facility] Patient Information 2015 [place], [place]. This information is not intended to replace advice given to you by your health care provider. Make sure you discuss any questions you have with your health care provider.  Emergency Department Resource Guide 1)  Find a Doctor and Pay Out of Pocket Although you won't have to find out who is covered by your insurance plan, it is a good idea to ask around and get recommendations. You will then need to call the office and see if the doctor you have chosen will accept you as a new patient and what types of options they offer for patients who are self-pay. Some doctors offer discounts or will set up payment plans for their patients who do not have insurance, but you will need to ask so you aren't surprised when you get to your appointment.  2) Contact Your Local Health Department Not all health departments have doctors that can see patients for sick visits, but many do, so it is worth a call to see if yours does. If you don't know where your local health department is, you can check in your phone book. The CDC also has a tool to help you locate your state's health department, and many state websites also have listings of all of their local health departments.  3) Find a [facility] If your illness is not likely to be very severe or complicated, you may want to try a walk in clinic. These are popping up all over the country in pharmacies, drugstores, and shopping centers. They're usually staffed by nurse practitioners or physician assistants that have been trained to treat common illnesses and complaints. They're usually fairly quick and inexpensive. However, if you have serious medical  issues or chronic medical problems, these are probably not your best option.  No Primary Care Doctor: - Call Health Connect at  [phone] - they can help you locate a primary care doctor that  accepts your insurance, provides certain services, etc. - Physician Referral Service- [phone]  Chronic Pain Problems: Organization         Address  Phone   Notes  [facility]  [phone] Patients need to be referred by their primary care doctor.   Medication Assistance: Organization         Address  Phone    Notes  [facility] Medication [facility] [address] [place], [place] [address] [phone] --Must be a resident of [facility] -- Must have NO insurance coverage whatsoever (no Medicaid/ Medicare, etc.) -- The pt. MUST have a primary care doctor that directs their care regularly and follows them in the community   MedAssist  [phone]   [employer]  [phone]    Agencies that provide inexpensive medical care: Organization         Address  Phone   Notes  [place]  [phone]   [facility]    [phone]   [facility] [place], [place] [address] [phone]   [place] [address] [place] [phone]   Planned Parenthood    ([phone]   [facility]    [phone]   [place] and [address], [place] Phone:  [phone], Fax:  [phone] Hours of Operation:  9 am - 6 pm, M-F.  Also accepts Medicaid/Medicare and self-pay.  [facility] for [place], [address], [place] Phone: [phone], Fax: [phone]. Hours of Operation:  8:30 am - 5:30 pm, M-F.  Also accepts Medicaid and self-pay.  [facility] [place] [address], [place] Phone: [phone]   [place], [place], [place] [phone], Ext. [phone] Mondays & Thursdays: 7-9 AM.  First 15 patients are seen on a first come, first serve basis.    [place] Providers:  Organization         Address  Phone   Notes  [facility] [address], [place] [phone] Also accepts self-pay patients.  [facility] [address], [place]  [phone]   [place], [address], [place] [phone]   [facility]  [facility] [address] [place] [phone]   Lucianne Lei [address], [place]   [phone] Only accepts [place] Access [place] patients after they have their name applied to their card.   Self-Pay (no insurance) in [place]:  Organization         Address  Phone   Notes  Sickle Cell Patients, [facility] [place] [phone]   [facility] [place] [phone]   [facility] [place]  [address], [address], [place] [phone]   [facility]/Dr. Osei-Bonsu  [address], [place] or [address], [place] [phone] Phone number for both West and [place] locations is the  same.  [facility] [address] [place] [phone]   [facility] [address] [place] or [address] [phone]   [facility] [address], [place] [phone], phone; [phone], fax Sees patients 1st and 3rd Saturday of every month.  Must not qualify for public or private insurance (i.e. Medicaid, Medicare, [place] Health Choice, Veterans' Benefits)  Household income should be no more than 200% of the poverty level The clinic cannot treat you if you are pregnant or think you are pregnant  Sexually transmitted diseases are not treated at the clinic.    Dental Care: Organization         Address  Phone  Notes  [facility] Department of [facility] [place] [phone] Accepts children up to age 31 who are enrolled in [place] or [place]; pregnant women with a Medicaid card; and children who have applied for Medicaid or [place] Health Choice, but were declined, whose parents can pay a reduced fee at time of service.  [facility]  [address], [place] [phone] Accepts children up to age 34 who are enrolled in [place] or [place]; pregnant women with a Medicaid card; and children who have applied for Medicaid or [place] Health Choice, but were declined, whose parents can pay a reduced fee at time of service.  [place] Adult Dental Access PROGRAM  [place] [phone] Patients are seen by appointment only. Walk-ins are not accepted. [place] will see patients 70 years of age and older. Monday - Tuesday (8am-5pm) Most Wednesdays (8:30-5pm) $30 per visit, cash only  [facility] Adult Dental Access PROGRAM  [address], [facility] [phone] Patients are seen by appointment only. Walk-ins are not accepted. [place] will see patients 38 years of age and older. One Wednesday Evening (Monthly: Volunteer Based).  $30 per visit, cash only  [place]  [phone] for adults; Children under age 66, call Graduate Pediatric Dentistry at [phone]. Children aged 18-14, please call [phone] to request a pediatric application.  Dental services are provided in all areas of dental care including fillings, crowns and bridges, complete and partial dentures, implants, gum treatment, root canals, and extractions. Preventive care is also provided. Treatment is provided to both adults and children. Patients are selected via a lottery and there is often a waiting list.   [facility] [address], [place]  [phone] [email or web address]   [facility] [address] [place], [place] [phone], Ext. [phone] Second and Fourth Thursday of each month, opens at 6:30 AM; Clinic ends at 9 AM.  Patients are seen on a first-come first-served basis, and a limited number are seen during each clinic.   [facility]  [address] Hillard Danker [place], [place] [phone]   Eligibility Requirements You must have lived in  [place], [place], or [place] counties for at least the last three months.   You cannot be eligible for state or federal sponsored [employer], including [employer], [place], or [employer].   You generally cannot be eligible for healthcare insurance through your employer.    How to apply: Eligibility screenings are held every Tuesday and Wednesday afternoon from 1:00 pm until 4:00 pm. You do not need an appointment for  the interview!  [facility] [address], [place], [place]   [place]  [place] Department  [place]  [phone]    Behavioral Health Resources in the Community: Intensive Outpatient Programs Organization         Address  Phone  Notes  [place]. [address], [place], [place] [phone]   [facility] Outpatient [address] [place], [place]   ADS: Alcohol & Drug Svcs [address] [place], [place]   [place] [address]  [place], [place] or [phone]   Substance Abuse Resources Organization         Address  Phone  Notes  Alcohol and Drug Services  [phone]   [place]  [phone]   [facility] [place]   Chinita Pester  [phone]   Residential & Outpatient Substance Abuse Program  [phone]   Psychological Services Organization         Address  Phone  Notes  [facility] [place]  [place]  [phone]   [place] [address], [place] or [phone]    Mobile Crisis Teams Organization         Address  Phone  Notes  [facility], Mobile Crisis Care Unit  [phone]   [facility]  [address] [place], [place]   [place] Levels [address], [place] [phone]    Self-Help/Support Groups Organization         Address  Phone             Notes  [place]. of [place] - variety of support groups  [place] Call for more information  Narcotics Anonymous (NA), [facility] [address], [employer] [place]  2 meetings at this location   Special educational needs teacher         Address  Phone  Notes  ASAP Residential Treatment [place],    [place]  [phone]-([phone]) [phone]   [facility]  [address], [place] [address], [place], [place]   [place], [place] [phone] Admissions: 8am-3pm M-F  Incentives Substance [place] [address],    [place], [place] [phone]   [facility] [address] [place], [place], [place]   [facility] [address],  [place], [place]   [facility] - Intensive Outpatient [address] Kristeen [address], [place], [place]   [facility] ([place].,  [place], [place] [phone] or [phone]   [facility] ([facility]) [address], [place], [place] Accepts Medicaid  [facility] [address]  [place] [phone] Substance Abuse/Addiction Treatment   [facility] Organization         Address  Phone  Notes  [facility]  [phone]   Domenic Schwab, PhD [address] Arlis Port[place], [place]   [phone] or [phone]   [facility] [place], [place] [phone]   [place] [address], [place], [place] [phone] Insurance/Medicaid/sponsorship through [employer] and Families [address]  [place], [place] [phone] [place], [place] [phone]    Dr. Adele Schilder  [phone]   [facility] [place] Dept. 1) [address] [place] 2) [place] 3)  [place] [address], [place] [phone]  [phone]   [place] [phone] or [phone] (After Hours)

## 2014-02-14 ENCOUNTER — Encounter (HOSPITAL_BASED_OUTPATIENT_CLINIC_OR_DEPARTMENT_OTHER): Payer: Self-pay | Admitting: Emergency Medicine

## 2014-02-22 ENCOUNTER — Encounter (HOSPITAL_COMMUNITY): Payer: Self-pay | Admitting: *Deleted

## 2014-02-22 ENCOUNTER — Emergency Department (HOSPITAL_COMMUNITY)
Admission: EM | Admit: 2014-02-22 | Discharge: 2014-02-22 | Disposition: A | Discharge status: Home / Self Care | Payer: Self-pay | Source: Home / Self Care | Attending: Family Medicine | Admitting: Family Medicine

## 2014-02-22 DIAGNOSIS — J309 Allergic rhinitis, unspecified: Secondary | ICD-10-CM

## 2014-02-22 DIAGNOSIS — G44219 Episodic tension-type headache, not intractable: Secondary | ICD-10-CM

## 2014-02-22 MED ORDER — CETIRIZINE-PSEUDOEPHEDRINE ER 5-120 MG PO TB12: 1.0000 | ORAL_TABLET | Freq: Every day | ORAL | Status: DC

## 2014-02-22 MED ORDER — FLUTICASONE PROPIONATE 50 MCG/ACT NA SUSP: 2.0000 | Freq: Every day | NASAL | Status: DC

## 2014-02-22 NOTE — ED Notes (Signed)
Pt  Has  Symptoms  Of  Sinus   Congestion   With        Drainage         And   Headache /   dizzyness           With  Onset  Of  Symptoms   X  2  Days         Pt    Sitting  Upright on the  Exam table  Speaking in  Complete  sentances  And  Appears  In no  Severe  Distress

## 2014-02-22 NOTE — ED Provider Notes (Signed)
CSN: [id number]     Arrival date & time 02/22/14  1724 History   First MD Initiated Contact with Patient 02/22/14 1734     Chief Complaint  Patient presents with  . URI   (Consider location/radiation/quality/duration/timing/severity/associated sxs/prior Treatment)  HPI   Patient is a 38 year old female presenting tonight with complaints of a headache with "dizzy spells" since this past Saturday. Patient states that the room has been "spinning" when she sits up and that she has just been feeling "off". She has no medical insurance or primary care provider. States has a history of migraine without aura and no diagnosed history of high blood pressure. Familial history of high blood pressure. Was seen last month at [facility] ED and prescribed antibiotic and Sudafed for sinus infection symptoms.  Past Medical History  Diagnosis Date  . Fibroids   . Lupus     Of the Skin only  . Anemia   . Headache(784.0)   . Heart murmur     as a child  . Anxiety     no meds   Past Surgical History  Procedure Laterality Date  . No past surgeries    . Supracervical abdominal hysterectomy N/A 11/25/2012    Procedure: HYSTERECTOMY SUPRACERVICAL ABDOMINAL;  Surgeon: Mora Bellman, MD;  Location: [facility];  Service: Gynecology;  Laterality: N/A;   Family History  Problem Relation Age of Onset  . Diabetes Mother   . Hypertension Mother   . Hyperlipidemia Father   . Cancer Paternal Aunt   . Diabetes Maternal Grandmother   . Diabetes Paternal Grandmother    History  Substance Use Topics  . Smoking status: Never Smoker   . Smokeless tobacco: Never Used  . Alcohol Use: Yes     Comment: occasional   OB History    Gravida Para Term Preterm AB TAB SAB Ectopic Multiple Living   2 1 1  0 1 1 0 0 0 1     Review of Systems  Constitutional: Negative.  Negative for fever, chills and fatigue.  HENT: Positive for congestion, hearing loss, rhinorrhea and sinus pressure. Negative for ear discharge, ear pain,  facial swelling, nosebleeds, postnasal drip, sneezing, sore throat, trouble swallowing and voice change.   Eyes: Negative.  Negative for photophobia, pain and redness.  Respiratory: Negative.  Negative for cough, choking, chest tightness, shortness of breath and wheezing.   Cardiovascular: Negative.  Negative for chest pain.  Gastrointestinal: Negative.  Negative for nausea, vomiting and diarrhea.  Endocrine: Negative.   Musculoskeletal: Negative.  Negative for arthralgias, neck pain and neck stiffness.  Skin: Negative.  Negative for rash.  Allergic/Immunologic: Positive for environmental allergies.  Neurological: Positive for dizziness, light-headedness and headaches. Negative for tremors, seizures, syncope, facial asymmetry, speech difficulty, weakness and numbness.  Hematological: Negative.   Psychiatric/Behavioral: Negative.        She reports history of anxiety.       Allergies  Latex  Home Medications   Prior to Admission medications   Medication Sig Start Date End Date Taking? Authorizing Provider  cetirizine-pseudoephedrine (ZYRTEC-D) 5-120 MG per tablet Take 1 tablet by mouth daily. 02/22/14   Nehemiah Settle, NP  cholecalciferol (VITAMIN D) 1000 UNITS tablet Take 1,000 Units by mouth daily.    Historical Provider, MD  clobetasol (TEMOVATE) 0.05 % external solution Apply topically daily as needed. For flare up    Historical Provider, MD  fluconazole (DIFLUCAN) 150 MG tablet Take 1 tablet (150 mg total) by mouth once. 12/18/13   Sam  Winfred Leeds, MD  fluticasone (FLONASE) 50 MCG/ACT nasal spray Place 2 sprays into both nostrils daily. 02/22/14   Nehemiah Settle, NP  Multiple Vitamins-Iron (MULTIVITAMINS WITH IRON) TABS Take 1 tablet by mouth daily.    Historical Provider, MD  sulfamethoxazole-trimethoprim (SEPTRA DS) 800-160 MG per tablet Take 1 tablet by mouth every 12 (twelve) hours. 12/18/13   Orlie Dakin, MD   BP 160/96 mmHg  Pulse 102  Temp(Src) 98.9 F (37.2 C)  (Oral)  Resp 12  SpO2 100%  LMP 11/12/2012   Physical Exam  Constitutional: She is oriented to person, place, and time. She appears well-developed and well-nourished. No distress.  HENT:  Head: Normocephalic and atraumatic.  Right Ear: External ear normal.  Left Ear: External ear normal.  Mouth/Throat: Oropharynx is clear and moist. No oropharyngeal exudate.  Bilateral tympanic membranes pearly gray in appearance with light reflexes present, distorted bilaterally. Fluid present behind tympanic membranes. Difficulty visualizing bony prominences.  Bilateral Mnire's patent but swollen and reddened in appearance.   Eyes: Conjunctivae and EOM are normal. Pupils are equal, round, and reactive to light. Right eye exhibits no discharge. Left eye exhibits no discharge. No scleral icterus.  Neck: Normal range of motion. Neck supple. No tracheal deviation present.  Mild anterior cervical lymphadenopathy present bilaterally.  Cardiovascular: Normal rate, regular rhythm, normal heart sounds and intact distal pulses.  Exam reveals no gallop and no friction rub.   No murmur heard. Pulmonary/Chest: Effort normal and breath sounds normal. No respiratory distress. She has no wheezes. She has no rales. She exhibits no tenderness.  No adventitious breath sounds noted.  Musculoskeletal: Normal range of motion.  Lymphadenopathy:    She has cervical adenopathy.  Neurological: She is alert and oriented to person, place, and time. She has normal reflexes. She displays normal reflexes. No cranial nerve deficit. She exhibits normal muscle tone. Coordination normal.  Radial nerves II through XII grossly intact. Negative Romberg. Patient able to squat. Heel toe walk and tandem gait intact.  Skin: Skin is warm and dry. No rash noted. She is not diaphoretic. No erythema. No pallor.  Psychiatric: She has a normal mood and affect.  Nursing note and vitals reviewed.   ED Course  Procedures (including critical care  time) Labs Review Labs Reviewed - No data to display  Imaging Review No results found.  Patient admitted to using over-the-counter Afrin nasal spray regularly to assist with nasal congestion. In addition she's been taking Claritin and Sudafed for seasonal allergy relief.     MDM   1. Allergic rhinitis, unspecified allergic rhinitis type   2. Episodic tension-type headache, not intractable    Meds ordered this encounter  Medications  . fluticasone (FLONASE) 50 MCG/ACT nasal spray    Sig: Place 2 sprays into both nostrils daily.    Dispense:  16 g    Refill:  2  . cetirizine-pseudoephedrine (ZYRTEC-D) 5-120 MG per tablet    Sig: Take 1 tablet by mouth daily.    Dispense:  15 tablet    Refill:  0    The patient is to cease use of Afrin nasal spray immediately. Patient is to use Flonase after irritated nasal passages have healed. Patient to use Vaseline or OTC bacitracin for comfort.  Zyrtec-D nightly then over-the-counter Zyrtec without Sudafed for relief. In addition patient advised in proper use of Neti pot for symptomatic relief.  In addition, [facility] provider.  Nehemiah Settle, NP 02/22/14 [place]

## 2014-02-22 NOTE — Discharge Instructions (Signed)
A neti pot is a container designed to rinse debris or mucus from your nasal cavity. You might use a neti pot to treat symptoms of nasal allergies, sinus problems or colds. °If you choose to make your own saltwater solution, it's important to use bottled water that has been distilled or sterilized. Tap water is acceptable if it's been boiled for several minutes and then left to cool until it is lukewarm. °To use the neti pot, tilt your head sideways over the sink and place the spout of the neti pot in the upper nostril. Breathing through your open mouth, gently pour the saltwater solution into your upper nostril so that the liquid drains through the lower nostril. Repeat on the other side. °Be sure to rinse the irrigation device after each use with similarly distilled, sterile, previously boiled and cooled, or filtered water and leave open to air dry. °Neti pots are often available in pharmacies and health food stores, as well online.  ° ° °Allergic Rhinitis °Allergic rhinitis is when the mucous membranes in the nose respond to allergens. Allergens are particles in the air that cause your body to have an allergic reaction. This causes you to release allergic antibodies. Through a chain of events, these eventually cause you to release histamine into the blood stream. Although meant to protect the body, it is this release of histamine that causes your discomfort, such as frequent sneezing, congestion, and an itchy, runny nose.  °CAUSES  °Seasonal allergic rhinitis (hay fever) is caused by pollen allergens that may come from grasses, trees, and weeds. Year-round allergic rhinitis (perennial allergic rhinitis) is caused by allergens such as house dust mites, pet dander, and mold spores.  °SYMPTOMS  °· Nasal stuffiness (congestion). °· Itchy, runny nose with sneezing and tearing of the eyes. °DIAGNOSIS  °Your health care provider can help you determine the allergen or allergens that trigger your symptoms. If you and your  health care provider are unable to determine the allergen, skin or blood testing may be used. °TREATMENT  °Allergic rhinitis does not have a cure, but it can be controlled by: °· Medicines and allergy shots (immunotherapy). °· Avoiding the allergen. °Hay fever may often be treated with antihistamines in pill or nasal spray forms. Antihistamines block the effects of histamine. There are over-the-counter medicines that may help with nasal congestion and swelling around the eyes. Check with your health care provider before taking or giving this medicine.  °If avoiding the allergen or the medicine prescribed do not work, there are many new medicines your health care provider can prescribe. Stronger medicine may be used if initial measures are ineffective. Desensitizing injections can be used if medicine and avoidance does not work. Desensitization is when a patient is given ongoing shots until the body becomes less sensitive to the allergen. Make sure you follow up with your health care provider if problems continue. °HOME CARE INSTRUCTIONS °It is not possible to completely avoid allergens, but you can reduce your symptoms by taking steps to limit your exposure to them. It helps to know exactly what you are allergic to so that you can avoid your specific triggers. °SEEK MEDICAL CARE IF:  °· You have a fever. °· You develop a cough that does not stop easily (persistent). °· You have shortness of breath. °· You start wheezing. °· Symptoms interfere with normal daily activities. °Document Released: 12/25/2000 Document Revised: 04/06/2013 Document Reviewed: 12/07/2012 °Exit[employer]® Patient Information ©2015 [employer]. This information is not intended to replace advice given   to you by your health care provider. Make sure you discuss any questions you have with your health care provider.

## 2014-04-08 ENCOUNTER — Encounter (HOSPITAL_BASED_OUTPATIENT_CLINIC_OR_DEPARTMENT_OTHER): Payer: Self-pay | Admitting: Emergency Medicine

## 2014-04-08 ENCOUNTER — Emergency Department (HOSPITAL_BASED_OUTPATIENT_CLINIC_OR_DEPARTMENT_OTHER)
Admission: EM | Admit: 2014-04-08 | Discharge: 2014-04-09 | Disposition: A | Discharge status: Home / Self Care | Payer: Self-pay | Attending: Emergency Medicine | Admitting: Emergency Medicine

## 2014-04-08 DIAGNOSIS — M79642 Pain in left hand: Secondary | ICD-10-CM | POA: Insufficient documentation

## 2014-04-08 DIAGNOSIS — Z7951 Long term (current) use of inhaled steroids: Secondary | ICD-10-CM | POA: Insufficient documentation

## 2014-04-08 DIAGNOSIS — F419 Anxiety disorder, unspecified: Secondary | ICD-10-CM | POA: Insufficient documentation

## 2014-04-08 DIAGNOSIS — R011 Cardiac murmur, unspecified: Secondary | ICD-10-CM | POA: Insufficient documentation

## 2014-04-08 DIAGNOSIS — D649 Anemia, unspecified: Secondary | ICD-10-CM | POA: Insufficient documentation

## 2014-04-08 DIAGNOSIS — Z862 Personal history of diseases of the blood and blood-forming organs and certain disorders involving the immune mechanism: Secondary | ICD-10-CM | POA: Insufficient documentation

## 2014-04-08 DIAGNOSIS — Z792 Long term (current) use of antibiotics: Secondary | ICD-10-CM | POA: Insufficient documentation

## 2014-04-08 DIAGNOSIS — Z91040 Latex allergy status: Secondary | ICD-10-CM | POA: Insufficient documentation

## 2014-04-08 DIAGNOSIS — Z79899 Other long term (current) drug therapy: Secondary | ICD-10-CM | POA: Insufficient documentation

## 2014-04-08 NOTE — ED Notes (Signed)
C/o sharp pain and tingling in left hand and forearm onset around 9 pm

## 2014-04-08 NOTE — ED Notes (Signed)
Pain in left hand and forearm that started suddenly at 9pm.

## 2014-04-09 ENCOUNTER — Emergency Department (HOSPITAL_BASED_OUTPATIENT_CLINIC_OR_DEPARTMENT_OTHER): Payer: Self-pay

## 2014-04-09 MED ORDER — IBUPROFEN 400 MG PO TABS
600.0000 mg | ORAL_TABLET | Freq: Once | ORAL | Status: AC
  Administered 2014-04-09: 600 mg via ORAL
  Filled 2014-04-09 (×2): qty 1

## 2014-04-09 MED ORDER — IBUPROFEN 600 MG PO TABS: 600.0000 mg | ORAL_TABLET | Freq: Three times a day (TID) | ORAL | Status: DC

## 2014-04-09 NOTE — ED Notes (Signed)
Patient transported to X-ray 

## 2014-04-09 NOTE — ED Provider Notes (Signed)
CSN: [id number]     Arrival date & time 04/08/14  2244 History   First MD Initiated Contact with Patient 04/09/14 0036     Chief Complaint  Patient presents with  . Arm Pain     (Consider location/radiation/quality/duration/timing/severity/associated sxs/prior Treatment) HPI Patient presents with several hours of left hand pain. This pain radiates up into her forearm. She describes the pain as a deep aching pain. She has no numbness. No previous similar episodes of pain. Pain is worse with palpation and flexion and extension of the digits. There is been no swelling, erythema or warmth. Patient denies any trauma. She states she did recently take a job where she was typing much more than normal. She denies any neck or shoulder pain. Past Medical History  Diagnosis Date  . Fibroids   . Lupus     Of the Skin only  . Anemia   . Headache(784.0)   . Heart murmur     as a child  . Anxiety     no meds   Past Surgical History  Procedure Laterality Date  . No past surgeries    . Supracervical abdominal hysterectomy N/A 11/25/2012    Procedure: HYSTERECTOMY SUPRACERVICAL ABDOMINAL;  Surgeon: Mora Bellman, MD;  Location: [facility];  Service: Gynecology;  Laterality: N/A;  . Abdominal hysterectomy     Family History  Problem Relation Age of Onset  . Diabetes Mother   . Hypertension Mother   . Hyperlipidemia Father   . Cancer Paternal Aunt   . Diabetes Maternal Grandmother   . Diabetes Paternal Grandmother    History  Substance Use Topics  . Smoking status: Never Smoker   . Smokeless tobacco: Never Used  . Alcohol Use: Yes     Comment: occasional   OB History    Gravida Para Term Preterm AB TAB SAB Ectopic Multiple Living   2 1 1  0 1 1 0 0 0 1     Review of Systems  Constitutional: Negative for fever and chills.  Musculoskeletal: Positive for arthralgias. Negative for myalgias, neck pain and neck stiffness.  Skin: Negative for rash and wound.  Neurological: Negative for  dizziness, weakness, light-headedness and numbness.  All other systems reviewed and are negative.     Allergies  Latex  Home Medications   Prior to Admission medications   Medication Sig Start Date End Date Taking? Authorizing Provider  cetirizine-pseudoephedrine (ZYRTEC-D) 5-120 MG per tablet Take 1 tablet by mouth daily. 02/22/14   Nehemiah Settle, NP  cholecalciferol (VITAMIN D) 1000 UNITS tablet Take 1,000 Units by mouth daily.    Historical Provider, MD  clobetasol (TEMOVATE) 0.05 % external solution Apply topically daily as needed. For flare up    Historical Provider, MD  fluconazole (DIFLUCAN) 150 MG tablet Take 1 tablet (150 mg total) by mouth once. 12/18/13   Orlie Dakin, MD  fluticasone (FLONASE) 50 MCG/ACT nasal spray Place 2 sprays into both nostrils daily. 02/22/14   Nehemiah Settle, NP  ibuprofen (ADVIL,MOTRIN) 600 MG tablet Take 1 tablet (600 mg total) by mouth 3 (three) times daily after meals. 04/09/14   Julianne Rice, MD  Multiple Vitamins-Iron (MULTIVITAMINS WITH IRON) TABS Take 1 tablet by mouth daily.    Historical Provider, MD  sulfamethoxazole-trimethoprim (SEPTRA DS) 800-160 MG per tablet Take 1 tablet by mouth every 12 (twelve) hours. 12/18/13   Orlie Dakin, MD   BP 181/105 mmHg  Pulse 99  Temp(Src) 99.1 F (37.3 C) (Oral)  Resp 16  Ht 5\' 6"  (1.676 m)  Wt 190 lb (86.183 kg)  BMI 30.68 kg/m2  SpO2 100%  LMP 11/12/2012 Physical Exam  Constitutional: She is oriented to person, place, and time. She appears well-developed and well-nourished. No distress.  HENT:  Head: Normocephalic and atraumatic.  Mouth/Throat: Oropharynx is clear and moist.  Eyes: EOM are normal. Pupils are equal, round, and reactive to light.  Neck: Normal range of motion. Neck supple.  No meningismus. No midline cervical tenderness to palpation.  Cardiovascular: Normal rate and regular rhythm.   Pulmonary/Chest: Effort normal and breath sounds normal. No respiratory distress.  She has no wheezes. She has no rales.  Abdominal: Soft. Bowel sounds are normal. She exhibits no distension and no mass. There is no tenderness. There is no rebound and no guarding.  Musculoskeletal: Normal range of motion. She exhibits tenderness. She exhibits no edema.  Negative Tinel's and Phalen's sign. Distal pulses intact. Good cap refill. Patient with tenderness to palpation over the MCP joints of the third and fourth digits of the left hand. There is no swelling. No erythema. No warmth.  Neurological: She is alert and oriented to person, place, and time.  5/5 grip strength bilaterally. Sensation is intact.  Skin: Skin is warm and dry. No rash noted. No erythema.  Psychiatric: She has a normal mood and affect. Her behavior is normal.  Nursing note and vitals reviewed.   ED Course  Procedures (including critical care time) Labs Review Labs Reviewed - No data to display  Imaging Review Dg Hand Complete Left  04/09/2014   CLINICAL DATA:  Diffuse and hip pain beginning today, LEFT arm weakness. No trauma.  EXAM: LEFT HAND - COMPLETE 3+ VIEW  COMPARISON:  None.  FINDINGS: There is no evidence of fracture or dislocation. There is no evidence of arthropathy or other focal bone abnormality. Soft tissues are unremarkable.  IMPRESSION: Negative.   Electronically Signed   By: Elon Alas   On: 04/09/2014 01:41     EKG Interpretation None      MDM   Final diagnoses:  Hand pain, left    No acute findings on x-ray. Suspect arthralgia possibly to overuse. No evidence of swelling or inflammatory changes. We'll treat symptomatically. Return precautions given.    Julianne Rice, MD 04/09/14 [phone]

## 2014-04-09 NOTE — Discharge Instructions (Signed)

## 2014-04-19 ENCOUNTER — Encounter: Payer: Self-pay | Admitting: Internal Medicine

## 2014-04-19 ENCOUNTER — Ambulatory Visit: Payer: Self-pay | Attending: Internal Medicine | Admitting: Internal Medicine

## 2014-04-19 VITALS — BP 148/100 | HR 103 | Temp 98.7°F | Resp 16 | Ht 66.0 in | Wt 204.0 lb

## 2014-04-19 DIAGNOSIS — Z791 Long term (current) use of non-steroidal anti-inflammatories (NSAID): Secondary | ICD-10-CM | POA: Insufficient documentation

## 2014-04-19 DIAGNOSIS — IMO0001 Reserved for inherently not codable concepts without codable children: Secondary | ICD-10-CM | POA: Insufficient documentation

## 2014-04-19 DIAGNOSIS — F419 Anxiety disorder, unspecified: Secondary | ICD-10-CM | POA: Insufficient documentation

## 2014-04-19 DIAGNOSIS — J329 Chronic sinusitis, unspecified: Secondary | ICD-10-CM | POA: Insufficient documentation

## 2014-04-19 DIAGNOSIS — L304 Erythema intertrigo: Secondary | ICD-10-CM | POA: Insufficient documentation

## 2014-04-19 DIAGNOSIS — E669 Obesity, unspecified: Secondary | ICD-10-CM

## 2014-04-19 DIAGNOSIS — D649 Anemia, unspecified: Secondary | ICD-10-CM | POA: Insufficient documentation

## 2014-04-19 DIAGNOSIS — R030 Elevated blood-pressure reading, without diagnosis of hypertension: Secondary | ICD-10-CM | POA: Insufficient documentation

## 2014-04-19 MED ORDER — FLUCONAZOLE 150 MG PO TABS: 150.0000 mg | ORAL_TABLET | Freq: Once | ORAL | Status: DC

## 2014-04-19 MED ORDER — AMOXICILLIN-POT CLAVULANATE 875-125 MG PO TABS: 1.0000 | ORAL_TABLET | Freq: Two times a day (BID) | ORAL | Status: DC

## 2014-04-19 MED ORDER — KETOCONAZOLE 2 % EX CREA: 1.0000 "application " | TOPICAL_CREAM | Freq: Every day | CUTANEOUS | Status: DC

## 2014-04-19 NOTE — Progress Notes (Signed)
Patient ID: Holly Valencia, female   DOB: [birth year], 39 y.o.   MRN: [id number]   Holly Valencia, is a 39 y.o. female  KGU:[id number]  SEG:[id number]  DOB - 31-Jul-1975  CC:  Chief Complaint  Patient presents with  . Establish Care       HPI: Holly Valencia is a 38 y.o. female here today to establish medical care.  Patient has a past medical history of Lupus, fibroids, anemia, and anxiety.  Patient reports that she has been having difficulty with allergies and sinus congestion since August.  She has tried several OTC agents and uses a Neti pot daily. She has symptoms of sinus pressure, ear ache, dizziness, and headache.  She reports some chills.  She was told in the ER that her blood pressure was elevated and she needed follow up with a PCP.  Since she was told her BP was elevated she has been going to the pharmacy to have her pressures checked and they have always been over 737 systolic.  She is concerned about the medications raising her pressure or if she is really hypertensive. She did not take any decongestant medication today to see if that would lower her pressure.   Patient c/o of a rash under her left breast and groin that is erythematous and pruritic.  The rash has not spread and has been present for 2 weeks.  She would like a referral to have a mammogram.    Allergies  Allergen Reactions  . Latex Itching   Past Medical History  Diagnosis Date  . Fibroids   . Lupus     Of the Skin only  . Anemia   . Headache(784.0)   . Heart murmur     as a child  . Anxiety     no meds   Current Outpatient Prescriptions on File Prior to Visit  Medication Sig Dispense Refill  . cetirizine-pseudoephedrine (ZYRTEC-D) 5-120 MG per tablet Take 1 tablet by mouth daily. 15 tablet 0  . cholecalciferol (VITAMIN D) 1000 UNITS tablet Take 1,000 Units by mouth daily.    . clobetasol (TEMOVATE) 0.05 % external solution Apply topically daily as needed. For flare up    . fluticasone  (FLONASE) 50 MCG/ACT nasal spray Place 2 sprays into both nostrils daily. 16 g 2  . ibuprofen (ADVIL,MOTRIN) 600 MG tablet Take 1 tablet (600 mg total) by mouth 3 (three) times daily after meals. 30 tablet 0  . Multiple Vitamins-Iron (MULTIVITAMINS WITH IRON) TABS Take 1 tablet by mouth daily.    . fluconazole (DIFLUCAN) 150 MG tablet Take 1 tablet (150 mg total) by mouth once. (Patient not taking: Reported on 04/19/2014) 1 tablet 1  . sulfamethoxazole-trimethoprim (SEPTRA DS) 800-160 MG per tablet Take 1 tablet by mouth every 12 (twelve) hours. (Patient not taking: Reported on 04/19/2014) 20 tablet 0   No current facility-administered medications on file prior to visit.   Family History  Problem Relation Age of Onset  . Diabetes Mother   . Hypertension Mother   . Hyperlipidemia Father   . Cancer Paternal Aunt   . Diabetes Maternal Grandmother   . Diabetes Paternal Grandmother    History   Social History  . Marital Status: Single    Spouse Name: N/A    Number of Children: N/A  . Years of Education: N/A   Occupational History  . Not on file.   Social History Main Topics  . Smoking status: Never Smoker   . Smokeless tobacco: Never Used  .  Alcohol Use: Yes     Comment: occasional  . Drug Use: No  . Sexual Activity: Not Currently    Birth Control/ Protection: Surgical   Other Topics Concern  . Not on file   Social History Narrative    Review of Systems  Constitutional: Negative for fever and chills.  HENT: Positive for congestion, ear pain (L>R) and sore throat. Negative for hearing loss.   Eyes: Negative.   Respiratory: Negative.   Cardiovascular: Negative.   Genitourinary: Negative.   Musculoskeletal: Negative.   Neurological: Positive for dizziness and headaches.      Objective:   Filed Vitals:   04/19/14 1638  BP: 151/99  Pulse: 103  Temp: 98.7 F (37.1 C)  Resp: 16    Physical Exam: Constitutional: Patient appears well-developed and well-nourished. No  distress. HENT: Normocephalic, atraumatic, External right and left ear normal. Oropharynx is clear and moist.  Eyes: Conjunctivae and EOM are normal. PERRLA, no scleral icterus. Neck: Normal ROM. Neck supple. No JVD. No tracheal deviation. No thyromegaly. CVS: RRR, S1/S2 +, no murmurs, no gallops, no carotid bruit.  Pulmonary: Effort and breath sounds normal, no stridor, rhonchi, wheezes, rales.  Abdominal: Soft. BS +, no distension, tenderness, rebound or guarding.  Neuro: Alert. Skin: Skin is warm and dry. No pallor. Psychiatric: Normal mood and affect. Behavior, judgment, thought content normal.  Physical Exam  Pulmonary/Chest: Right breast exhibits no inverted nipple and no mass. Left breast exhibits no inverted nipple and no mass.  Genitourinary: No breast tenderness or discharge.  Skin:     intertrigo  '  Lab Results  Component Value Date   WBC 8.0 11/26/2012   HGB 9.4* 11/26/2012   HCT 28.6* 11/26/2012   MCV 66.1* 11/26/2012   PLT 350 11/26/2012   Lab Results  Component Value Date   CREATININE 0.60 10/10/2011   BUN 10 10/10/2011   NA 137 10/10/2011   K 3.7 10/10/2011   CL 100 10/10/2011   CO2 27 10/10/2011    No results found for: HGBA1C Lipid Panel  No results found for: CHOL, TRIG, HDL, CHOLHDL, VLDL, LDLCALC     Assessment and plan:   Holly Valencia was seen today for establish care.  Diagnoses and associated orders for this visit:  Chronic sinusitis, unspecified location - fluconazole (DIFLUCAN) 150 MG tablet; Take 1 tablet (150 mg total) by mouth once. - amoxicillin-clavulanate (AUGMENTIN) 875-125 MG per tablet; Take 1 tablet by mouth 2 (two) times daily.  Elevated BP  Explained to patient diet modifications that may contribute to elevated BP. Patient will avoid foods that are high in sodium such as  canned soups and vegetables, tomato juice, commercial baked goods, commercially prepared frozen or canned entrees and sauces. Avoid salty snacks, added salt  when cooking, and substituting for low sodium herbs or spices Explained exercise regimen of cardio at least three times weekly to help lower BP and cholesterol  RN will recheck pressure in one week, if elevated will start patient on Losartan.  Intertrigo - ketoconazole (NIZORAL) 2 % cream; Apply 1 application topically daily. If no improvement, come back and we may need to look at different treatment.   Return in about 1 week (around 04/26/2014) for Nurse Visit-BP check and lab.  Will need base line labs.  Pt will call for mammogram referral    Chari Manning, [facility] [phone] 04/19/2014, 4:52 PM

## 2014-04-19 NOTE — Patient Instructions (Signed)

## 2014-04-19 NOTE — Progress Notes (Signed)
Patient here to establish care. Patient has a history of seasonal allergies with sinus infections patient was seen in ED 7/15 for sinusitus.  While there the provider told her her blood pressure was  Elevated and patient is not sure if that is due to all the medications she is taking for her allergies, stress, etc. Patient continues to have trouble with her allergies and is using a neti pot daily. Patient had a hysterectomy 8/15 as well as pap smear-patient has not yet had mammogram. Patient also complains of rash under her left breast and under her stomach. Patient does not smoke Patient wanted flu shot but has latex allergy so was educated against. Patient has appointment 05/16/14 to get orange card.

## 2014-04-28 ENCOUNTER — Ambulatory Visit: Payer: Self-pay | Attending: Internal Medicine | Admitting: *Deleted

## 2014-04-28 VITALS — BP 145/98 | HR 95 | Temp 99.0°F | Resp 20

## 2014-04-28 DIAGNOSIS — IMO0001 Reserved for inherently not codable concepts without codable children: Secondary | ICD-10-CM

## 2014-04-28 DIAGNOSIS — I10 Essential (primary) hypertension: Secondary | ICD-10-CM | POA: Insufficient documentation

## 2014-04-28 DIAGNOSIS — R030 Elevated blood-pressure reading, without diagnosis of hypertension: Secondary | ICD-10-CM

## 2014-04-28 MED ORDER — LOSARTAN POTASSIUM 25 MG PO TABS: 25.0000 mg | ORAL_TABLET | Freq: Every day | ORAL | Status: DC

## 2014-04-28 MED ORDER — LOSARTAN POTASSIUM-HCTZ 100-25 MG PO TABS: 1.0000 | ORAL_TABLET | Freq: Every day | ORAL | Status: DC

## 2014-04-28 NOTE — Progress Notes (Signed)
Patient presents for BP check Med list reviewed; states taking all meds as directed Discussed need for low sodium diet and using Mrs. Dash as alternative to salt States she has not had a dose of pseudoephedrine in 1 week States she is under a lot of life stress and having anxiety Patient unable to receive flu vaccine due to latex allergy  BP 145/98 P 95 R 20   T  99.0 oral SPO2  97%  Per PCP: Losartan 25 mg daily #30 e-scribed to [facility] Return in 2 weeks for nurse visit for BP check  Patient advised to call for med refills at least 7 days before running out so as not to go without.  Patient given literature on DASH Eating Plan and Anxiety disorder

## 2014-04-28 NOTE — Patient Instructions (Addendum)
DASH Eating Plan DASH stands for "Dietary Approaches to Stop Hypertension." The DASH eating plan is a healthy eating plan that has been shown to reduce high blood pressure (hypertension). Additional health benefits may include reducing the risk of type 2 diabetes mellitus, heart disease, and stroke. The DASH eating plan may also help with weight loss. WHAT DO I NEED TO KNOW ABOUT THE DASH EATING PLAN? For the DASH eating plan, you will follow these general guidelines:  Choose foods with a percent daily value for sodium of less than 5% (as listed on the food label).  Use salt-free seasonings or herbs instead of table salt or sea salt.  Check with your health care provider or pharmacist before using salt substitutes.  Eat lower-sodium products, often labeled as "lower sodium" or "no salt added."  Eat fresh foods.  Eat more vegetables, fruits, and low-fat dairy products.  Choose whole grains. Look for the word "whole" as the first word in the ingredient list.  Choose fish and skinless chicken or turkey more often than red meat. Limit fish, poultry, and meat to 6 oz (170 g) each day.  Limit sweets, desserts, sugars, and sugary drinks.  Choose heart-healthy fats.  Limit cheese to 1 oz (28 g) per day.  Eat more home-cooked food and less restaurant, buffet, and fast food.  Limit fried foods.  Cook foods using methods other than frying.  Limit canned vegetables. If you do use them, rinse them well to decrease the sodium.  When eating at a restaurant, ask that your food be prepared with less salt, or no salt if possible. WHAT FOODS CAN I EAT? Seek help from a dietitian for individual calorie needs. Grains Whole grain or whole wheat bread. Brown rice. Whole grain or whole wheat pasta. Quinoa, bulgur, and whole grain cereals. Low-sodium cereals. Corn or whole wheat flour tortillas. Whole grain cornbread. Whole grain crackers. Low-sodium crackers. Vegetables Fresh or frozen vegetables  (raw, steamed, roasted, or grilled). Low-sodium or reduced-sodium tomato and vegetable juices. Low-sodium or reduced-sodium tomato sauce and paste. Low-sodium or reduced-sodium canned vegetables.  Fruits All fresh, canned (in natural juice), or frozen fruits. Meat and Other Protein Products Ground beef (85% or leaner), grass-fed beef, or beef trimmed of fat. Skinless chicken or turkey. Ground chicken or turkey. Pork trimmed of fat. All fish and seafood. Eggs. Dried beans, peas, or lentils. Unsalted nuts and seeds. Unsalted canned beans. Dairy Low-fat dairy products, such as skim or 1% milk, 2% or reduced-fat cheeses, low-fat ricotta or cottage cheese, or plain low-fat yogurt. Low-sodium or reduced-sodium cheeses. Fats and Oils Tub margarines without trans fats. Light or reduced-fat mayonnaise and salad dressings (reduced sodium). Avocado. Safflower, olive, or canola oils. Natural peanut or almond butter. Other Unsalted popcorn and pretzels. The items listed above may not be a complete list of recommended foods or beverages. Contact your dietitian for more options. WHAT FOODS ARE NOT RECOMMENDED? Grains White bread. White pasta. White rice. Refined cornbread. Bagels and croissants. Crackers that contain trans fat. Vegetables Creamed or fried vegetables. Vegetables in a cheese sauce. Regular canned vegetables. Regular canned tomato sauce and paste. Regular tomato and vegetable juices. Fruits Dried fruits. Canned fruit in light or heavy syrup. Fruit juice. Meat and Other Protein Products Fatty cuts of meat. Ribs, chicken wings, bacon, sausage, bologna, salami, chitterlings, fatback, hot dogs, bratwurst, and packaged luncheon meats. Salted nuts and seeds. Canned beans with salt. Dairy Whole or 2% milk, cream, half-and-half, and cream cheese. Whole-fat or sweetened yogurt. Full-fat   cheeses or blue cheese. Nondairy creamers and whipped toppings. Processed cheese, cheese spreads, or cheese  curds. Condiments Onion and garlic salt, seasoned salt, table salt, and sea salt. Canned and packaged gravies. Worcestershire sauce. Tartar sauce. Barbecue sauce. Teriyaki sauce. Soy sauce, including reduced sodium. Steak sauce. Fish sauce. Oyster sauce. Cocktail sauce. Horseradish. Ketchup and mustard. Meat flavorings and tenderizers. Bouillon cubes. Hot sauce. Tabasco sauce. Marinades. Taco seasonings. Relishes. Fats and Oils Butter, stick margarine, lard, shortening, ghee, and bacon fat. Coconut, palm kernel, or palm oils. Regular salad dressings. Other Pickles and olives. Salted popcorn and pretzels. The items listed above may not be a complete list of foods and beverages to avoid. Contact your dietitian for more information. WHERE CAN I FIND MORE INFORMATION? National Heart, Lung, and Blood Institute: travelstabloid.com Document Released: 03/21/2011 Document Revised: 08/16/2013 Document Reviewed: 02/03/2013 [facility] Patient Information 2015 [place], [place]. This information is not intended to replace advice given to you by your health care provider. Make sure you discuss any questions you have with your health care provider. Generalized Anxiety Disorder Generalized anxiety disorder (GAD) is a mental disorder. It interferes with life functions, including relationships, work, and school. GAD is different from normal anxiety, which everyone experiences at some point in their lives in response to specific life events and activities. Normal anxiety actually helps [place] prepare for and get through these life events and activities. Normal anxiety goes away after the event or activity is over.  GAD causes anxiety that is not necessarily related to specific events or activities. It also causes excess anxiety in proportion to specific events or activities. The anxiety associated with GAD is also difficult to control. GAD can vary from mild to severe. People with severe GAD can  have intense waves of anxiety with physical symptoms (panic attacks).  SYMPTOMS The anxiety and worry associated with GAD are difficult to control. This anxiety and worry are related to many life events and activities and also occur more days than not for 6 months or longer. People with GAD also have three or more of the following symptoms (one or more in children):  Restlessness.   Fatigue.  Difficulty concentrating.   Irritability.  Muscle tension.  Difficulty sleeping or unsatisfying sleep. DIAGNOSIS GAD is diagnosed through an assessment by your health care provider. Your health care provider will ask you questions aboutyour mood,physical symptoms, and events in your life. Your health care provider may ask you about your medical history and use of alcohol or drugs, including prescription medicines. Your health care provider may also do a physical exam and blood tests. Certain medical conditions and the use of certain substances can cause symptoms similar to those associated with GAD. Your health care provider may refer you to a mental health specialist for further evaluation. TREATMENT The following therapies are usually used to treat GAD:   Medication. Antidepressant medication usually is prescribed for long-term daily control. Antianxiety medicines may be added in severe cases, especially when panic attacks occur.   Talk therapy (psychotherapy). Certain types of talk therapy can be helpful in treating GAD by providing support, education, and guidance. A form of talk therapy called cognitive behavioral therapy can teach you healthy ways to think about and react to daily life events and activities.  Stress managementtechniques. These include yoga, meditation, and exercise and can be very helpful when they are practiced regularly. A mental health specialist can help determine which treatment is best for you. Some people see improvement with one therapy. However, other  people require  a combination of therapies. Document Released: 07/27/2012 Document Revised: 08/16/2013 Document Reviewed: 07/27/2012 [facility] Patient Information 2015 [place], [place]. This information is not intended to replace advice given to you by your health care provider. Make sure you discuss any questions you have with your health care provider.

## 2014-05-16 ENCOUNTER — Ambulatory Visit: Payer: Self-pay

## 2014-05-25 ENCOUNTER — Other Ambulatory Visit (HOSPITAL_COMMUNITY)
Admission: RE | Admit: 2014-05-25 | Discharge: 2014-05-25 | Disposition: A | Discharge status: Home / Self Care | Payer: Self-pay | Source: Ambulatory Visit | Attending: Family Medicine | Admitting: Family Medicine

## 2014-05-25 ENCOUNTER — Emergency Department (INDEPENDENT_AMBULATORY_CARE_PROVIDER_SITE_OTHER)
Admission: EM | Admit: 2014-05-25 | Discharge: 2014-05-25 | Disposition: A | Discharge status: Home / Self Care | Payer: Self-pay | Source: Home / Self Care | Attending: Family Medicine | Admitting: Family Medicine

## 2014-05-25 ENCOUNTER — Encounter (HOSPITAL_COMMUNITY): Payer: Self-pay | Admitting: Family Medicine

## 2014-05-25 DIAGNOSIS — N760 Acute vaginitis: Secondary | ICD-10-CM | POA: Insufficient documentation

## 2014-05-25 DIAGNOSIS — Z113 Encounter for screening for infections with a predominantly sexual mode of transmission: Secondary | ICD-10-CM | POA: Insufficient documentation

## 2014-05-25 DIAGNOSIS — N390 Urinary tract infection, site not specified: Secondary | ICD-10-CM

## 2014-05-25 MED ORDER — FLUCONAZOLE 150 MG PO TABS: 150.0000 mg | ORAL_TABLET | Freq: Every day | ORAL | Status: DC

## 2014-05-25 MED ORDER — CEPHALEXIN 500 MG PO CAPS: 500.0000 mg | ORAL_CAPSULE | Freq: Three times a day (TID) | ORAL | Status: DC

## 2014-05-25 NOTE — ED Notes (Signed)
C/o lower abdominal area pain

## 2014-05-25 NOTE — ED Notes (Signed)
Call back number for lab verified

## 2014-05-25 NOTE — Discharge Instructions (Signed)
Your vaginal symptoms are likely due to a yeast infection. Please start taking the Diflucan. It also appears that you have beginnings of urinary tract infection. Please take the Keflex as prescribed. Please return if you have any further worsening of her symptoms.

## 2014-05-25 NOTE — ED Provider Notes (Signed)
CSN: [id number]     Arrival date & time 05/25/14  1030 History   First MD Initiated Contact with Patient 05/25/14 1057     Chief Complaint  Patient presents with  . Abdominal Pain   (Consider location/radiation/quality/duration/timing/severity/associated sxs/prior Treatment) HPI  Vaginal irritation. Itchy. Boric acid suppositories and monostat w/o benefit. Ongoing for 7 days. No change. Sexually active w/o condoms. Denies abd pain, fevers, back pain, dysuria, frequency.  Associated w/ foul smelling urine   Past Medical History  Diagnosis Date  . Fibroids   . Lupus     Of the Skin only  . Anemia   . Headache(784.0)   . Heart murmur     as a child  . Anxiety     no meds   Past Surgical History  Procedure Laterality Date  . No past surgeries    . Supracervical abdominal hysterectomy N/A 11/25/2012    Procedure: HYSTERECTOMY SUPRACERVICAL ABDOMINAL;  Surgeon: Mora Bellman, MD;  Location: [facility];  Service: Gynecology;  Laterality: N/A;  . Abdominal hysterectomy     Family History  Problem Relation Age of Onset  . Diabetes Mother   . Hypertension Mother   . Hyperlipidemia Father   . Cancer Paternal Aunt   . Diabetes Maternal Grandmother   . Diabetes Paternal Grandmother    History  Substance Use Topics  . Smoking status: Never Smoker   . Smokeless tobacco: Never Used  . Alcohol Use: Yes     Comment: occasional   OB History    Gravida Para Term Preterm AB TAB SAB Ectopic Multiple Living   2 1 1  0 1 1 0 0 0 1     Review of Systems   Per HPI with all other pertinent systems negative.   Allergies  Latex  Home Medications   Prior to Admission medications   Medication Sig Start Date End Date Taking? Authorizing Provider  cephALEXin (KEFLEX) 500 MG capsule Take 1 capsule (500 mg total) by mouth 3 (three) times daily. 05/25/14   Waldemar Dickens, MD  cetirizine-pseudoephedrine (ZYRTEC-D) 5-120 MG per tablet Take 1 tablet by mouth daily. Patient not taking: Reported  on 04/28/2014 02/22/14   Nehemiah Settle, NP  cholecalciferol (VITAMIN D) 1000 UNITS tablet Take 1,000 Units by mouth daily.    Historical Provider, MD  clobetasol (TEMOVATE) 0.05 % external solution Apply topically daily as needed. For flare up    Historical Provider, MD  fluconazole (DIFLUCAN) 150 MG tablet Take 1 tablet (150 mg total) by mouth daily. Repeat dose in 3 days 05/25/14   Waldemar Dickens, MD  fluticasone [facility]) 50 MCG/ACT nasal spray Place 2 sprays into both nostrils daily. 02/22/14   Nehemiah Settle, NP  ibuprofen (ADVIL,MOTRIN) 600 MG tablet Take 1 tablet (600 mg total) by mouth 3 (three) times daily after meals. 04/09/14   Julianne Rice, MD  ketoconazole (NIZORAL) 2 % cream Apply 1 application topically daily. 04/19/14   Lance Bosch, NP  losartan (COZAAR) 25 MG tablet Take 1 tablet (25 mg total) by mouth daily. 04/28/14   Lance Bosch, NP  Multiple Vitamins-Iron (MULTIVITAMINS WITH IRON) TABS Take 1 tablet by mouth daily.    Historical Provider, MD   BP 145/93 mmHg  Pulse 104  Temp(Src) 99.7 F (37.6 C) (Oral)  Resp 16  SpO2 99%  LMP 11/12/2012 Physical Exam  Constitutional: She is oriented to person, place, and time. She appears well-developed and well-nourished. No distress.  HENT:  Head: Normocephalic  and atraumatic.  Eyes: EOM are normal. Pupils are equal, round, and reactive to light.  Neck: Normal range of motion.  Cardiovascular: Normal rate, normal heart sounds and intact distal pulses.   No murmur heard. Pulmonary/Chest: Effort normal and breath sounds normal.  Abdominal: Soft. She exhibits no distension.  Genitourinary:  Thick white vaginal discharge. No cervical motion tenderness.  Musculoskeletal: Normal range of motion. She exhibits no edema or tenderness.  Neurological: She is alert and oriented to person, place, and time.  Skin: Skin is warm. She is not diaphoretic.  Psychiatric: She has a normal mood and affect. Her behavior is normal.  Judgment normal.    ED Course  Procedures (including critical care time) Labs Review Labs Reviewed  CERVICOVAGINAL ANCILLARY ONLY    Imaging Review No results found.   MDM   1. UTI (lower urinary tract infection)   2. Vaginitis    Start Keflex for UTI. Start Diflucan for likely yeast vaginitis. STD panel sent.  Precautions given and all questions answered  Linna Darner, MD Family Medicine 05/25/2014, 11:18 AM      Waldemar Dickens, MD 05/25/14 [phone]

## 2014-05-26 ENCOUNTER — Ambulatory Visit: Payer: Self-pay | Attending: Internal Medicine | Admitting: *Deleted

## 2014-05-26 VITALS — BP 126/84 | HR 97 | Temp 98.7°F | Resp 22

## 2014-05-26 DIAGNOSIS — IMO0001 Reserved for inherently not codable concepts without codable children: Secondary | ICD-10-CM

## 2014-05-26 DIAGNOSIS — I10 Essential (primary) hypertension: Secondary | ICD-10-CM | POA: Insufficient documentation

## 2014-05-26 DIAGNOSIS — E669 Obesity, unspecified: Secondary | ICD-10-CM | POA: Insufficient documentation

## 2014-05-26 DIAGNOSIS — R030 Elevated blood-pressure reading, without diagnosis of hypertension: Secondary | ICD-10-CM

## 2014-05-26 LAB — CERVICOVAGINAL ANCILLARY ONLY
CHLAMYDIA, DNA PROBE: NEGATIVE
Neisseria Gonorrhea: NEGATIVE
WET PREP (BD AFFIRM): NEGATIVE
WET PREP (BD AFFIRM): NEGATIVE
WET PREP (BD AFFIRM): POSITIVE — AB

## 2014-05-26 NOTE — Progress Notes (Signed)
Patient presents for BP check Med list reviewed; states taking all meds as directed Discussed need for low sodium diet and using Mrs. Dash as alternative to salt Pt started at gym yesterday C/o Daily headaches for 7 days; rates 10/10 Has been under "extreme" stress (lost job, home, staying with family) States she hasn't been able to sleep, has had crying episodes PHQ-9 score of 15; GAD 17 Patient given information on [facility] [place] with walk-in hours Pt unable to receive flu vaccine due to latex allergy   BP 126/84 P 97 R 22  T  98.7 oral SPO2  99%  Patient advised to call for med refills at least 7 days before running out so as not to go without.  Patient advised to make f/u with PCP to discuss anxiety

## 2014-05-27 NOTE — ED Notes (Signed)
GC/Chlamydia neg., affirm: Candida and Trich neg., Gardnerella pos.  Message sent to Dr. Barbaraann Faster. Roselyn Meier 05/27/2014

## 2014-06-01 ENCOUNTER — Telehealth (HOSPITAL_COMMUNITY): Payer: Self-pay | Admitting: *Deleted

## 2014-06-01 MED ORDER — METRONIDAZOLE 500 MG PO TABS: 500.0000 mg | ORAL_TABLET | Freq: Two times a day (BID) | ORAL | Status: DC

## 2014-06-01 NOTE — ED Notes (Signed)
Dr. Marily Memos e-prescribed Flagyl to pt.'s pharmacy. I called pt. Pt. verified x 2 and given results. Pt. told she needs Flagyl for bacterial vaginosis.  Pt. told where to pick up her Rx.   Pt. instructed to no alcohol while taking this medication.  Holly Valencia 06/01/2014

## 2014-06-02 ENCOUNTER — Encounter: Payer: Self-pay | Admitting: Internal Medicine

## 2014-06-02 ENCOUNTER — Ambulatory Visit: Payer: Self-pay | Attending: Internal Medicine | Admitting: Internal Medicine

## 2014-06-02 VITALS — BP 140/100 | HR 98 | Temp 98.2°F | Resp 16 | Ht 66.0 in | Wt 208.0 lb

## 2014-06-02 DIAGNOSIS — F419 Anxiety disorder, unspecified: Secondary | ICD-10-CM | POA: Insufficient documentation

## 2014-06-02 DIAGNOSIS — I10 Essential (primary) hypertension: Secondary | ICD-10-CM | POA: Insufficient documentation

## 2014-06-02 MED ORDER — ALPRAZOLAM 0.5 MG PO TABS: 0.5000 mg | ORAL_TABLET | Freq: Two times a day (BID) | ORAL | Status: DC | PRN

## 2014-06-02 MED ORDER — LOSARTAN POTASSIUM 50 MG PO TABS: 50.0000 mg | ORAL_TABLET | Freq: Every day | ORAL | Status: DC

## 2014-06-02 NOTE — Progress Notes (Signed)
Pt is here following up on her HTN and anxiety. Pt reports having trouble sleeping. Pt states that she has unexplained itching.

## 2014-06-02 NOTE — Progress Notes (Signed)
Patient ID: Holly Valencia, female   DOB: October 20, 1975, 39 y.o.   MRN: [id number]  CC: HTN, anxiety HPI: Holly Valencia is a 39 y.o. female here today for a follow up visit.  Patient has past medical history of fibroids, HTN, and anxiety.  She states that she has been having symptoms of headaches, chest pressure, choking sensation, and itching. She knows that she has been stressing over losing job and house. Feels like some days she just wants to lay down and not move for the rest of the day. She has been staying up all day and night applying for jobs and feels very overwhelmed with the stress.   Allergies  Allergen Reactions  . Latex Itching   Past Medical History  Diagnosis Date  . Fibroids   . Lupus     Of the Skin only  . Anemia   . Headache(784.0)   . Heart murmur     as a child  . Anxiety     no meds  . Hypertension    Current Outpatient Prescriptions on File Prior to Visit  Medication Sig Dispense Refill  . losartan (COZAAR) 25 MG tablet Take 1 tablet (25 mg total) by mouth daily. 30 tablet 2  . cephALEXin (KEFLEX) 500 MG capsule Take 1 capsule (500 mg total) by mouth 3 (three) times daily. (Patient not taking: Reported on 05/26/2014) 15 capsule 0  . cetirizine-pseudoephedrine (ZYRTEC-D) 5-120 MG per tablet Take 1 tablet by mouth daily. (Patient not taking: Reported on 04/28/2014) 15 tablet 0  . cholecalciferol (VITAMIN D) 1000 UNITS tablet Take 1,000 Units by mouth daily.    . clobetasol (TEMOVATE) 0.05 % external solution Apply topically daily as needed. For flare up    . fluconazole (DIFLUCAN) 150 MG tablet Take 1 tablet (150 mg total) by mouth daily. Repeat dose in 3 days (Patient not taking: Reported on 05/26/2014) 2 tablet 0  . fluticasone (FLONASE) 50 MCG/ACT nasal spray Place 2 sprays into both nostrils daily. (Patient not taking: Reported on 06/02/2014) 16 g 2  . ibuprofen (ADVIL,MOTRIN) 600 MG tablet Take 1 tablet (600 mg total) by mouth 3 (three) times daily after  meals. (Patient not taking: Reported on 06/02/2014) 30 tablet 0  . ketoconazole (NIZORAL) 2 % cream Apply 1 application topically daily. (Patient not taking: Reported on 06/02/2014) 30 g 0  . metroNIDAZOLE (FLAGYL) 500 MG tablet Take 1 tablet (500 mg total) by mouth 2 (two) times daily. (Patient not taking: Reported on 06/02/2014) 14 tablet 0  . Multiple Vitamins-Iron (MULTIVITAMINS WITH IRON) TABS Take 1 tablet by mouth daily.    . [DISCONTINUED] losartan-hydrochlorothiazide (HYZAAR) 100-25 MG per tablet Take 1 tablet by mouth daily. 30 tablet 2   No current facility-administered medications on file prior to visit.   Family History  Problem Relation Age of Onset  . Diabetes Mother   . Hypertension Mother   . Hyperlipidemia Father   . Cancer Paternal Aunt   . Diabetes Maternal Grandmother   . Diabetes Paternal Grandmother    History   Social History  . Marital Status: Single    Spouse Name: N/A  . Number of Children: N/A  . Years of Education: N/A   Occupational History  . Not on file.   Social History Main Topics  . Smoking status: Never Smoker   . Smokeless tobacco: Never Used  . Alcohol Use: Yes     Comment: occasional  . Drug Use: No  . Sexual Activity: Not Currently  Birth Control/ Protection: Surgical   Other Topics Concern  . Not on file   Social History Narrative    Review of Systems: See HPI     Objective:   Filed Vitals:   06/02/14 1651  BP: 155/102  Pulse: 98  Temp: 98.2 F (36.8 C)  Resp: 16    Physical Exam  Constitutional: She is oriented to person, place, and time.  Neck: No thyromegaly present.  Cardiovascular: Normal rate, regular rhythm and normal heart sounds.   Pulmonary/Chest: Effort normal and breath sounds normal.  Musculoskeletal: She exhibits no edema.  Neurological: She is alert and oriented to person, place, and time.  Skin: Skin is warm and dry.     Lab Results  Component Value Date   WBC 8.0 11/26/2012   HGB 9.4*  11/26/2012   HCT 28.6* 11/26/2012   MCV 66.1* 11/26/2012   PLT 350 11/26/2012   Lab Results  Component Value Date   CREATININE 0.60 10/10/2011   BUN 10 10/10/2011   NA 137 10/10/2011   K 3.7 10/10/2011   CL 100 10/10/2011   CO2 27 10/10/2011    No results found for: HGBA1C Lipid Panel  No results found for: CHOL, TRIG, HDL, CHOLHDL, VLDL, LDLCALC     Assessment and plan:   Holly Valencia was seen today for follow-up.  Diagnoses and all orders for this visit:  Essential hypertension Orders: -     Increased losartan (COZAAR) 50 MG tablet; Take 1 tablet (50 mg total) by mouth daily. Patient blood pressure remains elevated today, will increase BP medication and have patient to return in 2 weeks for blood pressure recheck with nurse. Stressed diet changes, regular exercise regimen, and modifiable risk factors. Will follow up with CMP as needed, Will follow up with patient in 3-6 months.   Anxiety Orders: -     ALPRAZolam (XANAX) 0.5 MG tablet; Take 1 tablet (0.5 mg total) by mouth 2 (two) times daily as needed for anxiety. Discussed with patient that anxiety symptoms are her body's response to stress. Discussed the possible need to initiate SSRI. Discussed that benzo's are habit forming and should only be used when symptoms are severe and this will not be a cyclic fill drug.  Will follow up with patient as necessary to determine further needs   Return in about 2 weeks (around 06/16/2014) for Nurse Visit-BP check and 3 mo PCP .       Chari Manning, NP-C [facility] [phone] 06/02/2014, 5:37 PM

## 2014-06-02 NOTE — Patient Instructions (Signed)
Generalized Anxiety Disorder Generalized anxiety disorder (GAD) is a mental disorder. It interferes with life functions, including relationships, work, and school. GAD is different from normal anxiety, which everyone experiences at some point in their lives in response to specific life events and activities. Normal anxiety actually helps us prepare for and get through these life events and activities. Normal anxiety goes away after the event or activity is over.  GAD causes anxiety that is not necessarily related to specific events or activities. It also causes excess anxiety in proportion to specific events or activities. The anxiety associated with GAD is also difficult to control. GAD can vary from mild to severe. People with severe GAD can have intense waves of anxiety with physical symptoms (panic attacks).  SYMPTOMS The anxiety and worry associated with GAD are difficult to control. This anxiety and worry are related to many life events and activities and also occur more days than not for 6 months or longer. People with GAD also have three or more of the following symptoms (one or more in children):  Restlessness.   Fatigue.  Difficulty concentrating.   Irritability.  Muscle tension.  Difficulty sleeping or unsatisfying sleep. DIAGNOSIS GAD is diagnosed through an assessment by your health care provider. Your health care provider will ask you questions aboutyour mood,physical symptoms, and events in your life. Your health care provider may ask you about your medical history and use of alcohol or drugs, including prescription medicines. Your health care provider may also do a physical exam and blood tests. Certain medical conditions and the use of certain substances can cause symptoms similar to those associated with GAD. Your health care provider may refer you to a mental health specialist for further evaluation. TREATMENT The following therapies are usually used to treat GAD:    Medication. Antidepressant medication usually is prescribed for long-term daily control. Antianxiety medicines may be added in severe cases, especially when panic attacks occur.   Talk therapy (psychotherapy). Certain types of talk therapy can be helpful in treating GAD by providing support, education, and guidance. A form of talk therapy called cognitive behavioral therapy can teach you healthy ways to think about and react to daily life events and activities.  Stress managementtechniques. These include yoga, meditation, and exercise and can be very helpful when they are practiced regularly. A mental health specialist can help determine which treatment is best for you. Some people see improvement with one therapy. However, other people require a combination of therapies. Document Released: 07/27/2012 Document Revised: 08/16/2013 Document Reviewed: 07/27/2012 [employer] Patient Information 2015 [employer]. This information is not intended to replace advice given to you by your health care provider. Make sure you discuss any questions you have with your health care provider.  

## 2014-06-13 ENCOUNTER — Ambulatory Visit: Payer: Self-pay | Attending: Internal Medicine

## 2014-06-16 ENCOUNTER — Ambulatory Visit: Payer: Self-pay | Attending: Internal Medicine

## 2014-06-16 NOTE — Progress Notes (Unsigned)
Patient presents for blood pressure check BP-131/81 Pulse 80 O2 97% Temp-98.2  Patient reports she has been riding the stationary bike at [employer] 2 x per week and has trying to  Eat a low fat/salt diet.  Per Chari Manning patient is to continue current regimen.

## 2014-08-25 ENCOUNTER — Ambulatory Visit: Payer: Self-pay | Attending: Internal Medicine | Admitting: Internal Medicine

## 2014-08-25 ENCOUNTER — Encounter: Payer: Self-pay | Admitting: Internal Medicine

## 2014-08-25 VITALS — BP 131/86 | HR 98 | Temp 98.7°F | Resp 16 | Ht 66.0 in | Wt 205.0 lb

## 2014-08-25 DIAGNOSIS — I10 Essential (primary) hypertension: Secondary | ICD-10-CM | POA: Insufficient documentation

## 2014-08-25 DIAGNOSIS — E663 Overweight: Secondary | ICD-10-CM | POA: Insufficient documentation

## 2014-08-25 DIAGNOSIS — Z6833 Body mass index (BMI) 33.0-33.9, adult: Secondary | ICD-10-CM | POA: Insufficient documentation

## 2014-08-25 MED ORDER — LOSARTAN POTASSIUM 50 MG PO TABS: 50.0000 mg | ORAL_TABLET | Freq: Every day | ORAL | Status: DC

## 2014-08-25 NOTE — Progress Notes (Signed)
Patient ID: Holly Valencia, female   DOB: September 21, 1975, 39 y.o.   MRN: [id number] Subjective:  Holly Valencia is a 39 y.o. female with hypertension.  Has been out of her medication for 3 days. Has had a headache since then.  Current Outpatient Prescriptions  Medication Sig Dispense Refill  . ALPRAZolam (XANAX) 0.5 MG tablet Take 1 tablet (0.5 mg total) by mouth 2 (two) times daily as needed for anxiety. 60 tablet 0  . cephALEXin (KEFLEX) 500 MG capsule Take 1 capsule (500 mg total) by mouth 3 (three) times daily. (Patient not taking: Reported on 05/26/2014) 15 capsule 0  . cetirizine-pseudoephedrine (ZYRTEC-D) 5-120 MG per tablet Take 1 tablet by mouth daily. (Patient not taking: Reported on 04/28/2014) 15 tablet 0  . cholecalciferol (VITAMIN D) 1000 UNITS tablet Take 1,000 Units by mouth daily.    . clobetasol (TEMOVATE) 0.05 % external solution Apply topically daily as needed. For flare up    . fluconazole (DIFLUCAN) 150 MG tablet Take 1 tablet (150 mg total) by mouth daily. Repeat dose in 3 days (Patient not taking: Reported on 05/26/2014) 2 tablet 0  . fluticasone (FLONASE) 50 MCG/ACT nasal spray Place 2 sprays into both nostrils daily. (Patient not taking: Reported on 06/02/2014) 16 g 2  . ibuprofen (ADVIL,MOTRIN) 600 MG tablet Take 1 tablet (600 mg total) by mouth 3 (three) times daily after meals. (Patient not taking: Reported on 06/02/2014) 30 tablet 0  . ketoconazole (NIZORAL) 2 % cream Apply 1 application topically daily. (Patient not taking: Reported on 06/02/2014) 30 g 0  . losartan (COZAAR) 50 MG tablet Take 1 tablet (50 mg total) by mouth daily. (Patient not taking: Reported on 08/25/2014) 30 tablet 1  . metroNIDAZOLE (FLAGYL) 500 MG tablet Take 1 tablet (500 mg total) by mouth 2 (two) times daily. (Patient not taking: Reported on 06/02/2014) 14 tablet 0  . Multiple Vitamins-Iron (MULTIVITAMINS WITH IRON) TABS Take 1 tablet by mouth daily.    . [DISCONTINUED]  losartan-hydrochlorothiazide (HYZAAR) 100-25 MG per tablet Take 1 tablet by mouth daily. 30 tablet 2   No current facility-administered medications for this visit.    Hypertension ROS: taking medications as instructed, no medication side effects noted, no TIA's, no chest pain on exertion, no dyspnea on exertion, no swelling of ankles and no palpitations.  New concerns: none.   Objective:  BP 131/86 mmHg  Pulse 98  Temp(Src) 98.7 F (37.1 C) (Oral)  Resp 16  Ht 5\' 6"  (1.676 m)  Wt 205 lb (92.987 kg)  BMI 33.10 kg/m2  SpO2 98%  LMP 11/12/2012  Appearance alert, well appearing, and in no distress, oriented to person, place, and time and overweight. General exam BP noted to be well controlled today in office, S1, S2 normal, no gallop, no murmur, chest clear, no JVD, no HSM, no edema.  Lab review: no lab studies available for review at time of visit, I have again asked patient to come back for lab test.   Assessment:   Hypertension stable.   Plan:  Orders and follow up as documented in patient record. Reviewed diet, exercise and weight control. Recommended sodium restriction.   Return in about 1 week (around 09/01/2014) for Lab Visit and PCP-HTN.  Chari Manning, NP  08/25/2014, 10:30 PM

## 2014-08-25 NOTE — Progress Notes (Signed)
Pt is here following up on her HTN. Pt states that she ran out of her meds since Monday.

## 2014-08-25 NOTE — Patient Instructions (Signed)

## 2014-09-01 ENCOUNTER — Ambulatory Visit: Payer: Self-pay | Attending: Internal Medicine

## 2014-09-01 DIAGNOSIS — IMO0001 Reserved for inherently not codable concepts without codable children: Secondary | ICD-10-CM

## 2014-09-01 DIAGNOSIS — R030 Elevated blood-pressure reading, without diagnosis of hypertension: Secondary | ICD-10-CM

## 2014-09-01 DIAGNOSIS — E669 Obesity, unspecified: Secondary | ICD-10-CM

## 2014-09-01 LAB — COMPLETE METABOLIC PANEL WITH GFR
ALT: 26 U/L (ref 0–35)
AST: 26 U/L (ref 0–37)
Albumin: 3.8 g/dL (ref 3.5–5.2)
Alkaline Phosphatase: 63 U/L (ref 39–117)
BILIRUBIN TOTAL: 0.3 mg/dL (ref 0.2–1.2)
BUN: 7 mg/dL (ref 6–23)
CALCIUM: 9.2 mg/dL (ref 8.4–10.5)
CO2: 27 meq/L (ref 19–32)
CREATININE: 0.72 mg/dL (ref 0.50–1.10)
Chloride: 102 mEq/L (ref 96–112)
GFR, Est Non African American: >89 mL/min
GLUCOSE: 90 mg/dL (ref 70–99)
POTASSIUM: 4.8 meq/L (ref 3.5–5.3)
SODIUM: 138 meq/L (ref 135–145)
Total Protein: 7.4 g/dL (ref 6.0–8.3)

## 2014-09-01 LAB — CBC
HEMATOCRIT: 40 % (ref 36.0–46.0)
Hemoglobin: 13.6 g/dL (ref 12.0–15.0)
MCH: 28.2 pg (ref 26.0–34.0)
MCHC: 34 g/dL (ref 30.0–36.0)
MCV: 83 fL (ref 78.0–100.0)
MPV: 10 fL (ref 8.6–12.4)
Platelets: 309 10*3/uL (ref 150–400)
RBC: 4.82 MIL/uL (ref 3.87–5.11)
RDW: 14.1 % (ref 11.5–15.5)
WBC: 6.4 10*3/uL (ref 4.0–10.5)

## 2014-09-01 LAB — LIPID PANEL
CHOL/HDL RATIO: 3.6 ratio
CHOLESTEROL: 140 mg/dL (ref 0–200)
HDL: 39 mg/dL — AB (ref >=46)
LDL Cholesterol: 85 mg/dL (ref 0–99)
Triglycerides: 78 mg/dL (ref <150)
VLDL: 16 mg/dL (ref 0–40)

## 2014-09-01 LAB — HEMOGLOBIN A1C
Hgb A1c MFr Bld: 5.9 % — ABNORMAL HIGH (ref <5.7)
Mean Plasma Glucose: 123 mg/dL — ABNORMAL HIGH (ref <117)

## 2014-09-01 LAB — TSH: TSH: 3.017 u[IU]/mL (ref 0.350–4.500)

## 2014-09-14 ENCOUNTER — Telehealth: Payer: Self-pay | Admitting: *Deleted

## 2014-09-14 NOTE — Telephone Encounter (Signed)
Gave results to patient

## 2014-09-14 NOTE — Telephone Encounter (Signed)
-----   Message from Lance Bosch, NP sent at 09/12/2014 12:56 PM EDT ----- All last within normal limits

## 2014-10-03 ENCOUNTER — Emergency Department (HOSPITAL_COMMUNITY)
Admission: EM | Admit: 2014-10-03 | Discharge: 2014-10-04 | Disposition: A | Discharge status: Home / Self Care | Payer: Self-pay | Attending: Emergency Medicine | Admitting: Emergency Medicine

## 2014-10-03 ENCOUNTER — Encounter (HOSPITAL_COMMUNITY): Payer: Self-pay | Admitting: Emergency Medicine

## 2014-10-03 DIAGNOSIS — Z862 Personal history of diseases of the blood and blood-forming organs and certain disorders involving the immune mechanism: Secondary | ICD-10-CM | POA: Insufficient documentation

## 2014-10-03 DIAGNOSIS — R55 Syncope and collapse: Secondary | ICD-10-CM | POA: Insufficient documentation

## 2014-10-03 DIAGNOSIS — Z91040 Latex allergy status: Secondary | ICD-10-CM | POA: Insufficient documentation

## 2014-10-03 DIAGNOSIS — F410 Panic disorder [episodic paroxysmal anxiety] without agoraphobia: Secondary | ICD-10-CM | POA: Insufficient documentation

## 2014-10-03 DIAGNOSIS — Z8739 Personal history of other diseases of the musculoskeletal system and connective tissue: Secondary | ICD-10-CM | POA: Insufficient documentation

## 2014-10-03 DIAGNOSIS — R011 Cardiac murmur, unspecified: Secondary | ICD-10-CM | POA: Insufficient documentation

## 2014-10-03 DIAGNOSIS — Z86018 Personal history of other benign neoplasm: Secondary | ICD-10-CM | POA: Insufficient documentation

## 2014-10-03 DIAGNOSIS — I10 Essential (primary) hypertension: Secondary | ICD-10-CM | POA: Insufficient documentation

## 2014-10-03 DIAGNOSIS — Z79899 Other long term (current) drug therapy: Secondary | ICD-10-CM | POA: Insufficient documentation

## 2014-10-03 DIAGNOSIS — R000 Tachycardia, unspecified: Secondary | ICD-10-CM | POA: Insufficient documentation

## 2014-10-03 LAB — I-STAT CHEM 8, ED
BUN: 13 mg/dL (ref 6–20)
CALCIUM ION: 1.09 mmol/L — AB (ref 1.12–1.23)
CHLORIDE: 102 mmol/L (ref 101–111)
Creatinine, Ser: 0.8 mg/dL (ref 0.44–1.00)
Glucose, Bld: 148 mg/dL — ABNORMAL HIGH (ref 65–99)
HEMATOCRIT: 44 % (ref 36.0–46.0)
Hemoglobin: 15 g/dL (ref 12.0–15.0)
POTASSIUM: 3.2 mmol/L — AB (ref 3.5–5.1)
Sodium: 140 mmol/L (ref 135–145)
TCO2: 20 mmol/L (ref 0–100)

## 2014-10-03 LAB — I-STAT TROPONIN, ED: TROPONIN I, POC: 0.01 ng/mL (ref 0.00–0.08)

## 2014-10-03 LAB — CBC WITH DIFFERENTIAL/PLATELET
BASOS ABS: 0 10*3/uL (ref 0.0–0.1)
Basophils Relative: 0 % (ref 0–1)
EOS ABS: 0 10*3/uL (ref 0.0–0.7)
Eosinophils Relative: 0 % (ref 0–5)
HEMATOCRIT: 38.6 % (ref 36.0–46.0)
Hemoglobin: 13.5 g/dL (ref 12.0–15.0)
LYMPHS ABS: 2 10*3/uL (ref 0.7–4.0)
Lymphocytes Relative: 25 % (ref 12–46)
MCH: 28.8 pg (ref 26.0–34.0)
MCHC: 35 g/dL (ref 30.0–36.0)
MCV: 82.5 fL (ref 78.0–100.0)
Monocytes Absolute: 0.7 10*3/uL (ref 0.1–1.0)
Monocytes Relative: 9 % (ref 3–12)
Neutro Abs: 5.3 10*3/uL (ref 1.7–7.7)
Neutrophils Relative %: 66 % (ref 43–77)
PLATELETS: 272 10*3/uL (ref 150–400)
RBC: 4.68 MIL/uL (ref 3.87–5.11)
RDW: 13.6 % (ref 11.5–15.5)
WBC: 8.1 10*3/uL (ref 4.0–10.5)

## 2014-10-03 MED ORDER — LORAZEPAM 2 MG/ML IJ SOLN
1.0000 mg | Freq: Once | INTRAMUSCULAR | Status: AC
  Administered 2014-10-03: 1 mg via INTRAVENOUS
  Filled 2014-10-03: qty 1

## 2014-10-03 MED ORDER — SODIUM CHLORIDE 0.9 % IV BOLUS (SEPSIS)
1000.0000 mL | Freq: Once | INTRAVENOUS | Status: AC
  Administered 2014-10-03: 1000 mL via INTRAVENOUS

## 2014-10-03 MED ORDER — ALPRAZOLAM 0.25 MG PO TABS
0.5000 mg | ORAL_TABLET | Freq: Once | ORAL | Status: AC
  Administered 2014-10-03: 0.5 mg via ORAL
  Filled 2014-10-03: qty 2

## 2014-10-03 NOTE — ED Notes (Signed)
Pt found out that her Grandmother passed away and she loss conscious.

## 2014-10-03 NOTE — ED Notes (Signed)
Pt c/o being hot; ice packs given for patient. Family at bedside pt a&ox4

## 2014-10-03 NOTE — ED Provider Notes (Signed)
CSN: [id number]     Arrival date & time 10/03/14  2106 History   First MD Initiated Contact with Patient 10/03/14 2111     Chief Complaint  Patient presents with  . Loss of Consciousness     (Consider location/radiation/quality/duration/timing/severity/associated sxs/prior Treatment) The history is provided by the patient.  Holly Valencia is a 39 y.o. female hx of hypertension, anxiety here presenting with syncope. Patient was in the ER with her grandmother. Grandmother unfortunately had a cardiac arrest and died. She had the news and was very sad. She was with family when she passed out. She didn't fall and hit her head. Denies chest pain prior to passing out. Has hysterectomy.    Past Medical History  Diagnosis Date  . Fibroids   . Lupus     Of the Skin only  . Anemia   . Headache(784.0)   . Heart murmur     as a child  . Anxiety     no meds  . Hypertension    Past Surgical History  Procedure Laterality Date  . No past surgeries    . Supracervical abdominal hysterectomy N/A 11/25/2012    Procedure: HYSTERECTOMY SUPRACERVICAL ABDOMINAL;  Surgeon: Mora Bellman, MD;  Location: [facility];  Service: Gynecology;  Laterality: N/A;  . Abdominal hysterectomy     Family History  Problem Relation Age of Onset  . Diabetes Mother   . Hypertension Mother   . Hyperlipidemia Father   . Cancer Paternal Aunt   . Diabetes Maternal Grandmother   . Diabetes Paternal Grandmother    History  Substance Use Topics  . Smoking status: Never Smoker   . Smokeless tobacco: Never Used  . Alcohol Use: Yes     Comment: occasional   OB History    Gravida Para Term Preterm AB TAB SAB Ectopic Multiple Living   2 1 1  0 1 1 0 0 0 1     Review of Systems  Cardiovascular: Positive for syncope.  Neurological: Positive for syncope.  All other systems reviewed and are negative.     Allergies  Latex  Home Medications   Prior to Admission medications   Medication Sig Start Date End Date  Taking? Authorizing Provider  ALPRAZolam Duanne Moron) 0.5 MG tablet Take 1 tablet (0.5 mg total) by mouth 2 (two) times daily as needed for anxiety. 06/02/14  Yes Lance Bosch, NP  cholecalciferol (VITAMIN D) 1000 UNITS tablet Take 1,000 Units by mouth daily.   Yes Historical Provider, MD  losartan (COZAAR) 50 MG tablet Take 1 tablet (50 mg total) by mouth daily. 08/25/14  Yes Lance Bosch, NP  Multiple Vitamins-Iron (MULTIVITAMINS WITH IRON) TABS Take 1 tablet by mouth daily.    Historical Provider, MD   BP 152/74 mmHg  Pulse 120  Temp(Src) 98.6 F (37 C) (Axillary)  Resp 23  SpO2 100%  LMP 11/12/2012 Physical Exam  Constitutional: She is oriented to person, place, and time.  Crying, tearful, anxious   HENT:  Head: Normocephalic.  Mouth/Throat: Oropharynx is clear and moist.  Eyes: Conjunctivae are normal. Pupils are equal, round, and reactive to light.  Neck: Normal range of motion. Neck supple.  Cardiovascular: Regular rhythm and normal heart sounds.   tachy  Pulmonary/Chest: Effort normal and breath sounds normal. No respiratory distress. She has no wheezes. She has no rales.  Abdominal: Soft. Bowel sounds are normal. She exhibits no distension. There is no tenderness. There is no rebound.  Musculoskeletal: Normal range of motion.  She exhibits no edema or tenderness.  Neurological: She is alert and oriented to person, place, and time. No cranial nerve deficit. Coordination normal.  Skin: Skin is warm and dry.  Psychiatric: She has a normal mood and affect. Her behavior is normal. Judgment and thought content normal.  Nursing note and vitals reviewed.   ED Course  Procedures (including critical care time) Labs Review Labs Reviewed  I-STAT CHEM 8, ED - Abnormal; Notable for the following:    Potassium 3.2 (*)    Glucose, Bld 148 (*)    Calcium, Ion 1.09 (*)    All other components within normal limits  CBC WITH DIFFERENTIAL/PLATELET  I-STAT TROPOININ, ED    Imaging  Review No results found.   EKG Interpretation   Date/Time:  Monday October 03 2014 21:24:21 EDT Ventricular Rate:  118 PR Interval:  170 QRS Duration: 75 QT Interval:  308 QTC Calculation: 431 R Axis:   57 Text Interpretation:  Sinus tachycardia Anteroseptal infarct, old No  previous ECGs available Confirmed by YAO  MD, DAVID ([id number]) on 10/03/2014  9:31:02 PM      MDM   Final diagnoses:  Syncope, unspecified syncope type  Panic attack    Holly Valencia is a 39 y.o. female here with syncope when she heard that grandmother died. Likely vasovagal. She has no known cardiac disease. Appears anxious and likely that's why she is tachy. Denies chest pain or shortness of breath so I doubt PE.   11:54 PM Still tachy to 120s. Labs unremarkable. She went to visit her deceased grandmother. Can dc when she is calm and less tachy. Signed out to Dr. Stark Jock in the ED to reassess patient when she comes back and likely discharge.     Wandra Arthurs, MD 10/03/14 [phone]

## 2014-10-03 NOTE — Discharge Instructions (Signed)
Take xanax for anxiety.   See your doctor.   Return to ER if you have panic attacks, chest pain, passing out.

## 2014-10-04 NOTE — ED Notes (Signed)
Per provider, pt was disconnected from monitors and taken to view her family member.  RN remained w/ pt to monitor.  Pt back to room and placed back on monitor.

## 2014-10-04 NOTE — ED Provider Notes (Signed)
Patient signed out to me at shift change. Patient experienced a syncopal episode after finding out that a low one had passed away here in the emergency department. Her laboratory studies are unremarkable and vitals are stable. She was given anxiolytics and is feeling better. I have spoken with her and she feels as though she is ready to go home.  Veryl Speak, MD 10/04/14 Laureen Abrahams

## 2014-10-05 ENCOUNTER — Telehealth: Payer: Self-pay | Admitting: Internal Medicine

## 2014-10-05 NOTE — Telephone Encounter (Signed)
Pt was seen in ED for anxiety and physician there recommended pt resume taking anxiety medication.  Pt not sure if she needs to schedule appt, last seen on 08/25/14, to resume medication or if she can just request refill. Please f/u with pt.

## 2014-10-07 NOTE — Telephone Encounter (Signed)
Please send patient to [facility]

## 2014-10-07 NOTE — Telephone Encounter (Signed)
Nurse called patient, patient verified date of birth. Patient went to ED, they recommended she resume anxiety medication.  Patient wants to know if she needs appointment to discuss anxiety or if she can get a refill for anxiety med without appointment.  Patient scheduled appointment in case one is needed. IF it is not needed nurse will call patient and cancel appointment.  Message will be forwarded to [facility] for advice.

## 2014-10-10 NOTE — Telephone Encounter (Signed)
Pt returning nurse's call, please f/u with pt.

## 2014-10-10 NOTE — Telephone Encounter (Signed)
Nurse called patient, reached voicemail. Left message for patient to call Buren Havey with [facility], at [phone]. Nurse called patient to send patient to [facility] per [facility] request.

## 2014-10-10 NOTE — Telephone Encounter (Signed)
See below

## 2014-10-13 ENCOUNTER — Encounter: Payer: Self-pay | Admitting: Internal Medicine

## 2014-10-13 ENCOUNTER — Ambulatory Visit: Payer: Self-pay | Attending: Internal Medicine | Admitting: Internal Medicine

## 2014-10-13 VITALS — BP 127/84 | HR 76 | Temp 98.8°F | Resp 16 | Wt 195.6 lb

## 2014-10-13 DIAGNOSIS — M542 Cervicalgia: Secondary | ICD-10-CM

## 2014-10-13 DIAGNOSIS — I10 Essential (primary) hypertension: Secondary | ICD-10-CM | POA: Insufficient documentation

## 2014-10-13 DIAGNOSIS — F419 Anxiety disorder, unspecified: Secondary | ICD-10-CM

## 2014-10-13 MED ORDER — CYCLOBENZAPRINE HCL 5 MG PO TABS: 5.0000 mg | ORAL_TABLET | Freq: Two times a day (BID) | ORAL | Status: DC | PRN

## 2014-10-13 MED ORDER — ALPRAZOLAM 0.5 MG PO TABS: 0.5000 mg | ORAL_TABLET | Freq: Two times a day (BID) | ORAL | Status: DC | PRN

## 2014-10-13 NOTE — Progress Notes (Signed)
  Pt present today for a medication refill on Xanax and to discuss her sharp right side neck pain. She states her symptoms started about 2 weeks ago after finding out the passing of her grandmother.

## 2014-10-13 NOTE — Patient Instructions (Signed)
Do not take muscle relaxant and Xanax together. They both may cause drowsiness and potentially be dangerous when taken together

## 2014-10-13 NOTE — Progress Notes (Signed)
Patient ID: Holly Valencia, female   DOB: October 11, 1975, 39 y.o.   MRN: [id number]  CC: anxiety, neck pain   HPI: Holly Valencia is a 39 y.o. female here today for a follow up visit.  Patient has past medical history of anxiety, anemia, and HTN. She reports that her grandmother passed away 2 weeks ago and that when she noticed her anxiety coming back and the right neck pain. The neck pain is described as a tight/sharp pain. She denies fevers, chills, nausea, vomiting, or stiff neck. She states that when she found out about her grandmother she passed out at the ER and that's when all of her problems started. She states that whenever she discusses the death of her grandmother she begins to notices the tightness in her neck and shoulders that does not radiate. She had a EKG in the ER that was normal. She states that when she takes her Xanax the neck tightness and feelings of anxiety go away.  Patient has No headache, No chest pain, No abdominal pain - No Nausea, No new weakness tingling or numbness, No Cough - SOB.  Allergies  Allergen Reactions  . Latex Itching   Past Medical History  Diagnosis Date  . Fibroids   . Lupus     Of the Skin only  . Anemia   . Headache(784.0)   . Heart murmur     as a child  . Anxiety     no meds  . Hypertension    Current Outpatient Prescriptions on File Prior to Visit  Medication Sig Dispense Refill  . ALPRAZolam (XANAX) 0.5 MG tablet Take 1 tablet (0.5 mg total) by mouth 2 (two) times daily as needed for anxiety. 60 tablet 0  . cholecalciferol (VITAMIN D) 1000 UNITS tablet Take 1,000 Units by mouth daily.    Marland Kitchen losartan (COZAAR) 50 MG tablet Take 1 tablet (50 mg total) by mouth daily. 30 tablet 5  . Multiple Vitamins-Iron (MULTIVITAMINS WITH IRON) TABS Take 1 tablet by mouth daily.    . [DISCONTINUED] losartan-hydrochlorothiazide (HYZAAR) 100-25 MG per tablet Take 1 tablet by mouth daily. 30 tablet 2   No current facility-administered medications on  file prior to visit.   Family History  Problem Relation Age of Onset  . Diabetes Mother   . Hypertension Mother   . Hyperlipidemia Father   . Cancer Paternal Aunt   . Diabetes Maternal Grandmother   . Diabetes Paternal Grandmother    History   Social History  . Marital Status: Single    Spouse Name: N/A  . Number of Children: N/A  . Years of Education: N/A   Occupational History  . Not on file.   Social History Main Topics  . Smoking status: Never Smoker   . Smokeless tobacco: Never Used  . Alcohol Use: Yes     Comment: occasional  . Drug Use: No  . Sexual Activity: Not Currently    Birth Control/ Protection: Surgical   Other Topics Concern  . Not on file   Social History Narrative    Review of Systems: See HPI.    Objective:   Filed Vitals:   10/13/14 1446  BP: 127/84  Pulse: 76  Temp: 98.8 F (37.1 C)  Resp: 16    Physical Exam  Constitutional: She is oriented to person, place, and time.  Neck:  Muscle tightness noted in shoulders   Cardiovascular: Normal rate, regular rhythm and normal heart sounds.   Pulmonary/Chest: Effort normal and breath sounds  normal.  Musculoskeletal: She exhibits no tenderness.  Neurological: She is alert and oriented to person, place, and time.  Psychiatric: She has a normal mood and affect.   Lab Results  Component Value Date   WBC 8.1 10/03/2014   HGB 15.0 10/03/2014   HCT 44.0 10/03/2014   MCV 82.5 10/03/2014   PLT 272 10/03/2014   Lab Results  Component Value Date   CREATININE 0.80 10/03/2014   BUN 13 10/03/2014   NA 140 10/03/2014   K 3.2* 10/03/2014   CL 102 10/03/2014   CO2 27 09/01/2014    Lab Results  Component Value Date   HGBA1C 5.9* 09/01/2014   Lipid Panel     Component Value Date/Time   CHOL 140 09/01/2014 0912   TRIG 78 09/01/2014 0912   HDL 39* 09/01/2014 0912   CHOLHDL 3.6 09/01/2014 0912   VLDL 16 09/01/2014 0912   LDLCALC 85 09/01/2014 0912       Assessment and plan:    Holly Valencia was seen today for refill on xanax and neck pain.  Diagnoses and all orders for this visit:  Anxiety Orders: -     Refilled ALPRAZolam (XANAX) 0.5 MG tablet; Take 1 tablet (0.5 mg total) by mouth 2 (two) times daily as needed for anxiety. To take only as needed. Advised patient to find activities that she enjoy or exercise to help with symptoms   Neck pain Orders: -     cyclobenzaprine (FLEXERIL) 5 MG tablet; Take 1 tablet (5 mg total) by mouth 2 (two) times daily as needed for muscle spasms. Do not take muscle relaxant and Xanax together. They both may cause drowsiness and potentially be dangerous when taken together  Essential hypertension Patient blood pressure is stable and may continue on current medication.  Education on diet, exercise, and modifiable risk factors discussed. Will obtain appropriate labs as needed. Will follow up in 3-6 months.    Return in about 3 months (around 01/13/2015) for Hypertension.       Chari Manning, NP-C [facility] [phone] 10/13/2014, 3:03 PM

## 2014-10-18 NOTE — Telephone Encounter (Signed)
Nurse called patient, reached voicemail. Left message for patient to call [name] with [facility], at [phone].  

## 2014-10-21 ENCOUNTER — Ambulatory Visit: Payer: Self-pay | Attending: Internal Medicine

## 2014-11-08 ENCOUNTER — Ambulatory Visit: Payer: Self-pay | Attending: Internal Medicine | Admitting: Internal Medicine

## 2014-11-08 ENCOUNTER — Encounter: Payer: Self-pay | Admitting: Internal Medicine

## 2014-11-08 VITALS — BP 145/91 | HR 92 | Temp 98.3°F | Resp 18 | Ht 66.0 in | Wt 198.0 lb

## 2014-11-08 DIAGNOSIS — L930 Discoid lupus erythematosus: Secondary | ICD-10-CM | POA: Insufficient documentation

## 2014-11-08 DIAGNOSIS — I10 Essential (primary) hypertension: Secondary | ICD-10-CM | POA: Insufficient documentation

## 2014-11-08 DIAGNOSIS — F419 Anxiety disorder, unspecified: Secondary | ICD-10-CM | POA: Insufficient documentation

## 2014-11-08 DIAGNOSIS — B372 Candidiasis of skin and nail: Secondary | ICD-10-CM | POA: Insufficient documentation

## 2014-11-08 MED ORDER — KETOCONAZOLE 2 % EX CREA: 1.0000 "application " | TOPICAL_CREAM | Freq: Every day | CUTANEOUS | Status: DC

## 2014-11-08 MED ORDER — CLOBETASOL PROPIONATE 0.05 % EX SOLN: 1.0000 "application " | Freq: Two times a day (BID) | CUTANEOUS | Status: DC

## 2014-11-08 NOTE — Progress Notes (Signed)
Patient ID: Holly Valencia, female   DOB: 05-30-1975, 39 y.o.   MRN: [id number]  CC: rash   HPI: Holly Valencia is a 39 y.o. female here today for a follow up visit.  Patient has past medical history of skin lupus, HTN, and murmur. Patient reports that she was diagnosed with skin lupus by Dr. France Valencia more than 10 years ago. She is currently under the care of Dr. [facility] but states that she has not been in several months due to lack of insurance. She states that her lupus usually flares in the summer and she begins to itch and her skin tingles. She reports last being on clobetasol which helped significantly. She states that for the past week she has noticed that her scalp has been itchy and bumpy and she would like treatment before it gets severe. She reports that her rash has come back under her breast since it has been warm outside. She would like a refill of the Ketocanozole cream to go under her breast.   Patient has No headache, No chest pain, No abdominal pain - No Nausea, No new weakness tingling or numbness, No Cough - SOB.  Allergies  Allergen Reactions  . Latex Itching   Past Medical History  Diagnosis Date  . Fibroids   . Lupus     Of the Skin only  . Anemia   . Headache(784.0)   . Heart murmur     as a child  . Anxiety     no meds  . Hypertension    Current Outpatient Prescriptions on File Prior to Visit  Medication Sig Dispense Refill  . ALPRAZolam (XANAX) 0.5 MG tablet Take 1 tablet (0.5 mg total) by mouth 2 (two) times daily as needed for anxiety. 60 tablet 0  . cholecalciferol (VITAMIN D) 1000 UNITS tablet Take 1,000 Units by mouth daily.    Marland Kitchen losartan (COZAAR) 50 MG tablet Take 1 tablet (50 mg total) by mouth daily. 30 tablet 5  . Multiple Vitamins-Iron (MULTIVITAMINS WITH IRON) TABS Take 1 tablet by mouth daily.    . cyclobenzaprine (FLEXERIL) 5 MG tablet Take 1 tablet (5 mg total) by mouth 2 (two) times daily as needed for muscle spasms.  (Patient not taking: Reported on 11/08/2014) 30 tablet 1  . [DISCONTINUED] losartan-hydrochlorothiazide (HYZAAR) 100-25 MG per tablet Take 1 tablet by mouth daily. 30 tablet 2   No current facility-administered medications on file prior to visit.   Family History  Problem Relation Age of Onset  . Diabetes Mother   . Hypertension Mother   . Hyperlipidemia Father   . Cancer Paternal Aunt   . Diabetes Maternal Grandmother   . Diabetes Paternal Grandmother    History   Social History  . Marital Status: Single    Spouse Name: N/A  . Number of Children: N/A  . Years of Education: N/A   Occupational History  . Not on file.   Social History Main Topics  . Smoking status: Never Smoker   . Smokeless tobacco: Never Used  . Alcohol Use: Yes     Comment: occasional  . Drug Use: No  . Sexual Activity: Not Currently    Birth Control/ Protection: Surgical   Other Topics Concern  . Not on file   Social History Narrative    Review of Systems  Skin: Positive for itching and rash (scalp).    Objective:   Filed Vitals:   11/08/14 1436  BP: 145/91  Pulse: 92  Temp:  98.3 F (36.8 C)  Resp: 18    Physical Exam  Constitutional: She is oriented to person, place, and time.  Neurological: She is alert and oriented to person, place, and time.  Skin: Skin is warm and dry. Rash noted. No erythema.  Psychiatric: She has a normal mood and affect.    Lab Results  Component Value Date   WBC 8.1 10/03/2014   HGB 15.0 10/03/2014   HCT 44.0 10/03/2014   MCV 82.5 10/03/2014   PLT 272 10/03/2014   Lab Results  Component Value Date   CREATININE 0.80 10/03/2014   BUN 13 10/03/2014   NA 140 10/03/2014   K 3.2* 10/03/2014   CL 102 10/03/2014   CO2 27 09/01/2014    Lab Results  Component Value Date   HGBA1C 5.9* 09/01/2014   Lipid Panel     Component Value Date/Time   CHOL 140 09/01/2014 0912   TRIG 78 09/01/2014 0912   HDL 39* 09/01/2014 0912   CHOLHDL 3.6 09/01/2014 0912    VLDL 16 09/01/2014 0912   LDLCALC 85 09/01/2014 0912       Assessment and plan:   Holly Valencia was seen today for rash.  Diagnoses and all orders for this visit:  Discoid lupus Orders: -     Begin clobetasol (TEMOVATE) 0.05 % external solution; Apply 1 application topically 2 (two) times daily.  Candidal intertrigo Orders: -     Begin ketoconazole (NIZORAL) 2 % cream; Apply 1 application topically daily. Under breast Keep area clean and dry.    Return if symptoms worsen or fail to improve.      Holly Valencia, [facility] [phone] 11/08/2014, 2:58 PM

## 2014-11-08 NOTE — Patient Instructions (Signed)
Intertrigo Intertrigo is a skin condition that occurs in between folds of skin in places on the body that rub together a lot and do not get much ventilation. It is caused by heat, moisture, friction, sweat retention, and lack of air circulation, which produces red, irritated patches and, sometimes, scaling or drainage. People who have diabetes, who are obese, or who have treatment with antibiotics are at increased risk for intertrigo. The most common sites for intertrigo to occur include:  The groin.  The breasts.  The armpits.  Folds of abdominal skin.  Webbed spaces between the fingers or toes. Intertrigo may be aggravated by:  Sweat.  Feces.  Yeast or bacteria that are present near skin folds.  Urine.  Vaginal discharge. HOME CARE INSTRUCTIONS  The following steps can be taken to reduce friction and keep the affected area cool and dry:  Expose skin folds to the air.  Keep deep skin folds separated with cotton or linen cloth. Avoid tight fitting clothing that could cause chafing.  Wear open-toed shoes or sandals to help reduce moisture between the toes.  Apply absorbent powders to affected areas as directed by your caregiver.  Apply over-the-counter barrier pastes, such as zinc oxide, as directed by your caregiver.  If you develop a fungal infection in the affected area, your caregiver may have you use antifungal creams. SEEK MEDICAL CARE IF:   The rash is not improving after 1 week of treatment.  The rash is getting worse (more red, more swollen, more painful, or spreading).  You have a fever or chills. MAKE SURE YOU:   Understand these instructions.  Will watch your condition.  Will get help right away if you are not doing well or get worse. Document Released: 04/01/2005 Document Revised: 06/24/2011 Document Reviewed: 09/14/2009 Exit[employer] Patient Information 2015 [employer]. This information is not intended to replace advice given to you by your health  care provider. Make sure you discuss any questions you have with your health care provider.  

## 2014-11-08 NOTE — Progress Notes (Signed)
Patient here for skin lupus. Needs refill for clobetasol solution,  clobetasol cream and ketoconazole cream. Patient reports having some rash throughout body, on head and in ears. Rash itches. Rash hurts from scratching.

## 2015-01-14 ENCOUNTER — Other Ambulatory Visit (HOSPITAL_COMMUNITY)
Admission: RE | Admit: 2015-01-14 | Discharge: 2015-01-14 | Disposition: A | Discharge status: Home / Self Care | Payer: Self-pay | Source: Ambulatory Visit | Attending: Family Medicine | Admitting: Family Medicine

## 2015-01-14 ENCOUNTER — Encounter (HOSPITAL_COMMUNITY): Payer: Self-pay | Admitting: Emergency Medicine

## 2015-01-14 ENCOUNTER — Emergency Department (INDEPENDENT_AMBULATORY_CARE_PROVIDER_SITE_OTHER)
Admission: EM | Admit: 2015-01-14 | Discharge: 2015-01-14 | Disposition: A | Discharge status: Home / Self Care | Payer: Self-pay | Source: Home / Self Care | Attending: Family Medicine | Admitting: Family Medicine

## 2015-01-14 DIAGNOSIS — N760 Acute vaginitis: Secondary | ICD-10-CM | POA: Insufficient documentation

## 2015-01-14 DIAGNOSIS — A499 Bacterial infection, unspecified: Secondary | ICD-10-CM

## 2015-01-14 DIAGNOSIS — B9689 Other specified bacterial agents as the cause of diseases classified elsewhere: Secondary | ICD-10-CM

## 2015-01-14 DIAGNOSIS — Z113 Encounter for screening for infections with a predominantly sexual mode of transmission: Secondary | ICD-10-CM | POA: Insufficient documentation

## 2015-01-14 MED ORDER — METRONIDAZOLE 500 MG PO TABS: 500.0000 mg | ORAL_TABLET | Freq: Two times a day (BID) | ORAL | Status: DC

## 2015-01-14 NOTE — ED Provider Notes (Signed)
CSN: [id number]     Arrival date & time 01/14/15  1608 History   First MD Initiated Contact with Patient 01/14/15 1630     Chief Complaint  Patient presents with  . Vaginal Itching   (Consider location/radiation/quality/duration/timing/severity/associated sxs/prior Treatment) HPI Comments: 39 year old female complaining of vaginal irritation for 2 days. Denies discharge. Denies pelvic pain. Denies urinary symptoms. She has been treating this with boric acid suppositories.   Past Medical History  Diagnosis Date  . Fibroids   . 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 Anemia   . Headache(784.0)   . Heart murmur     as a child  . Anxiety     no meds  . Hypertension    Past Surgical History  Procedure Laterality Date  . No past surgeries    . Supracervical abdominal hysterectomy N/A 11/25/2012    Procedure: HYSTERECTOMY SUPRACERVICAL ABDOMINAL;  Surgeon: Mora Bellman, MD;  Location: [facility];  Service: Gynecology;  Laterality: N/A;  . Abdominal hysterectomy     Family History  Problem Relation Age of Onset  . Diabetes Mother   . Hypertension Mother   . Hyperlipidemia Father   . Cancer Paternal Aunt   . Diabetes Maternal Grandmother   . Diabetes Paternal Grandmother    Social History  Substance Use Topics  . Smoking status: Never Smoker   . Smokeless tobacco: Never Used  . Alcohol Use: Yes     Comment: occasional   OB History    Gravida Para Term Preterm AB TAB SAB Ectopic Multiple Living   2 1 1  0 1 1 0 0 0 1     Review of Systems  Constitutional: Negative.   Genitourinary: Positive for vaginal pain. Negative for dysuria, urgency, frequency, hematuria, flank pain, vaginal bleeding, vaginal discharge, genital sores, menstrual problem and pelvic pain.  Musculoskeletal: Negative.   All other systems reviewed and are negative.   Allergies  Latex  Home Medications   Prior to Admission medications   Medication Sig Start Date End Date Taking? Authorizing Provider   losartan (COZAAR) 50 MG tablet Take 1 tablet (50 mg total) by mouth daily. 08/25/14  Yes Lance Bosch, NP  ALPRAZolam Duanne Moron) 0.5 MG tablet Take 1 tablet (0.5 mg total) by mouth 2 (two) times daily as needed for anxiety. 10/13/14   Lance Bosch, NP  cholecalciferol (VITAMIN D) 1000 UNITS tablet Take 1,000 Units by mouth daily.    Historical Provider, MD  clobetasol (TEMOVATE) 0.05 % external solution Apply 1 application topically 2 (two) times daily. 11/08/14   Lance Bosch, NP  cyclobenzaprine (FLEXERIL) 5 MG tablet Take 1 tablet (5 mg total) by mouth 2 (two) times daily as needed for muscle spasms. Patient not taking: Reported on 11/08/2014 10/13/14   Lance Bosch, NP  ketoconazole (NIZORAL) 2 % cream Apply 1 application topically daily. Under breast 11/08/14   Lance Bosch, NP  metroNIDAZOLE (FLAGYL) 500 MG tablet Take 1 tablet (500 mg total) by mouth 2 (two) times daily. X 7 days 01/14/15   Janne Napoleon, NP  Multiple Vitamins-Iron (MULTIVITAMINS WITH IRON) TABS Take 1 tablet by mouth daily.    Historical Provider, MD   Meds Ordered and Administered this Visit  Medications - No data to display  BP 132/87 mmHg  Pulse 97  Temp(Src) 98.7 F (37.1 C) (Oral)  Resp 18  SpO2 97%  LMP 11/12/2012 No data found.   Physical Exam  Constitutional: She is  oriented to person, place, and time. She appears well-developed and well-nourished. No distress.  Eyes: EOM are normal.  Neck: Normal range of motion. Neck supple.  Cardiovascular: Normal rate.   Pulmonary/Chest: Effort normal. No respiratory distress.  Genitourinary:  Normal external female genitalia. No erythema, no external lesions and no external evidence of vaginal discharge. There is a small amount of thin white discharge in the vaginal wall. Cervix just left of midline. Ectocervix with faint erythema but no lesions. No CMT. No anterior pelvic tenderness.  Musculoskeletal: She exhibits no edema.  Neurological: She is alert and  oriented to person, place, and time. She exhibits normal muscle tone.  Skin: Skin is warm and dry.  Psychiatric: She has a normal mood and affect.  Nursing note and vitals reviewed.   ED Course  Procedures (including critical care time)  Labs Review Labs Reviewed  CERVICOVAGINAL ANCILLARY ONLY    Imaging Review No results found.   Visual Acuity Review  Right Eye Distance:   Left Eye Distance:   Bilateral Distance:    Right Eye Near:   Left Eye Near:    Bilateral Near:         MDM   1. BV (bacterial vaginosis)    Flagyl as dir Cervical cyto pending   Janne Napoleon, NP 01/14/15 1728

## 2015-01-14 NOTE — Discharge Instructions (Signed)
Bacterial Vaginosis °Bacterial vaginosis is a vaginal infection that occurs when the normal balance of bacteria in the vagina is disrupted. It results from an overgrowth of certain bacteria. This is the most common vaginal infection in women of childbearing age. Treatment is important to prevent complications, especially in pregnant women, as it can cause a premature delivery. °CAUSES  °Bacterial vaginosis is caused by an increase in harmful bacteria that are normally present in smaller amounts in the vagina. Several different kinds of bacteria can cause bacterial vaginosis. However, the reason that the condition develops is not fully understood. °RISK FACTORS °Certain activities or behaviors can put you at an increased risk of developing bacterial vaginosis, including: °· Having a new sex partner or multiple sex partners. °· Douching. °· Using an intrauterine device (IUD) for contraception. °Women do not get bacterial vaginosis from toilet seats, bedding, swimming pools, or contact with objects around them. °SIGNS AND SYMPTOMS  °Some women with bacterial vaginosis have no signs or symptoms. Common symptoms include: °· Grey vaginal discharge. °· A fishlike odor with discharge, especially after sexual intercourse. °· Itching or burning of the vagina and vulva. °· Burning or pain with urination. °DIAGNOSIS  °Your health care provider will take a medical history and examine the vagina for signs of bacterial vaginosis. A sample of vaginal fluid may be taken. Your health care provider will look at this sample under a microscope to check for bacteria and abnormal cells. A vaginal pH test may also be done.  °TREATMENT  °Bacterial vaginosis may be treated with antibiotic medicines. These may be given in the form of a pill or a vaginal cream. A second round of antibiotics may be prescribed if the condition comes back after treatment.  °HOME CARE INSTRUCTIONS  °· Only take over-the-counter or prescription medicines as  directed by your health care provider. °· If antibiotic medicine was prescribed, take it as directed. Make sure you finish it even if you start to feel better. °· Do not have sex until treatment is completed. °· Tell all sexual partners that you have a vaginal infection. They should see their health care provider and be treated if they have problems, such as a mild rash or itching. °· Practice safe sex by using condoms and only having one sex partner. °SEEK MEDICAL CARE IF:  °· Your symptoms are not improving after 3 days of treatment. °· You have increased discharge or pain. °· You have a fever. °MAKE SURE YOU:  °· Understand these instructions. °· Will watch your condition. °· Will get help right away if you are not doing well or get worse. °FOR MORE INFORMATION  °Centers for Disease Control and Prevention, Division of STD Prevention: www.cdc.gov/std °American Sexual Health Association (ASHA): www.ashastd.org  °Document Released: 04/01/2005 Document Revised: 01/20/2013 Document Reviewed: 11/11/2012 °ExitCare® Patient Information ©2015 [employer]. This information is not intended to replace advice given to you by your health care provider. Make sure you discuss any questions you have with your health care provider. ° °Vaginitis °Vaginitis is an inflammation of the vagina. It is most often caused by a change in the normal balance of the bacteria and yeast that live in the vagina. This change in balance causes an overgrowth of certain bacteria or yeast, which causes the inflammation. There are different types of vaginitis, but the most common types are: °· Bacterial vaginosis. °· Yeast infection (candidiasis). °· Trichomoniasis vaginitis. This is a sexually transmitted infection (STI). °· Viral vaginitis. °· Atropic vaginitis. °· Allergic vaginitis. °  CAUSES  °The cause depends on the type of vaginitis. Vaginitis can be caused by: °· Bacteria (bacterial vaginosis). °· Yeast (yeast infection). °· A parasite  (trichomoniasis vaginitis) °· A virus (viral vaginitis). °· Low hormone levels (atrophic vaginitis). Low hormone levels can occur during pregnancy, breastfeeding, or after menopause. °· Irritants, such as bubble baths, scented tampons, and feminine sprays (allergic vaginitis). °Other factors can change the normal balance of the yeast and bacteria that live in the vagina. These include: °· Antibiotic medicines. °· Poor hygiene. °· Diaphragms, vaginal sponges, spermicides, birth control pills, and intrauterine devices (IUD). °· Sexual intercourse. °· Infection. °· Uncontrolled diabetes. °· A weakened immune system. °SYMPTOMS  °Symptoms can vary depending on the cause of the vaginitis. Common symptoms include: °· Abnormal vaginal discharge. °¨ The discharge is white, gray, or yellow with bacterial vaginosis. °¨ The discharge is thick, white, and cheesy with a yeast infection. °¨ The discharge is frothy and yellow or greenish with trichomoniasis. °· A bad vaginal odor. °¨ The odor is fishy with bacterial vaginosis. °· Vaginal itching, pain, or swelling. °· Painful intercourse. °· Pain or burning when urinating. °Sometimes, there are no symptoms. °TREATMENT  °Treatment will vary depending on the type of infection.  °· Bacterial vaginosis and trichomoniasis are often treated with antibiotic creams or pills. °· Yeast infections are often treated with antifungal medicines, such as vaginal creams or suppositories. °· Viral vaginitis has no cure, but symptoms can be treated with medicines that relieve discomfort. Your sexual partner should be treated as well. °· Atrophic vaginitis may be treated with an estrogen cream, pill, suppository, or vaginal ring. If vaginal dryness occurs, lubricants and moisturizing creams may help. You may be told to avoid scented soaps, sprays, or douches. °· Allergic vaginitis treatment involves quitting the use of the product that is causing the problem. Vaginal creams can be used to treat the  symptoms. °HOME CARE INSTRUCTIONS  °· Take all medicines as directed by your caregiver. °· Keep your genital area clean and dry. Avoid soap and only rinse the area with water. °· Avoid douching. It can remove the healthy bacteria in the vagina. °· Do not use tampons or have sexual intercourse until your vaginitis has been treated. Use sanitary pads while you have vaginitis. °· Wipe from front to back. This avoids the spread of bacteria from the rectum to the vagina. °· Let air reach your genital area. °¨ Wear cotton underwear to decrease moisture buildup. °¨ Avoid wearing underwear while you sleep until your vaginitis is gone. °¨ Avoid tight pants and underwear or nylons without a cotton panel. °¨ Take off wet clothing (especially bathing suits) as soon as possible. °· Use mild, non-scented products. Avoid using irritants, such as: °¨ Scented feminine sprays. °¨ Fabric softeners. °¨ Scented detergents. °¨ Scented tampons. °¨ Scented soaps or bubble baths. °· Practice safe sex and use condoms. Condoms may prevent the spread of trichomoniasis and viral vaginitis. °SEEK MEDICAL CARE IF:  °· You have abdominal pain. °· You have a fever or persistent symptoms for more than 2-3 days. °· You have a fever and your symptoms suddenly get worse. °Document Released: 01/27/2007 Document Revised: 12/25/2011 Document Reviewed: 09/12/2011 °ExitCare® Patient Information ©2015 [employer]. This information is not intended to replace advice given to you by your health care provider. Make sure you discuss any questions you have with your health care provider. ° °

## 2015-01-14 NOTE — ED Notes (Signed)
C/o vaginal irritation onset Thursday Denies vag d/c, abd pain, urinary sx A&O x4... No acute distress.

## 2015-01-16 LAB — CERVICOVAGINAL ANCILLARY ONLY
Chlamydia: NEGATIVE
Neisseria Gonorrhea: NEGATIVE

## 2015-01-17 ENCOUNTER — Encounter (HOSPITAL_COMMUNITY): Payer: Self-pay | Admitting: Emergency Medicine

## 2015-01-17 ENCOUNTER — Emergency Department (INDEPENDENT_AMBULATORY_CARE_PROVIDER_SITE_OTHER)
Admission: EM | Admit: 2015-01-17 | Discharge: 2015-01-17 | Disposition: A | Discharge status: Home / Self Care | Payer: Self-pay | Source: Home / Self Care | Attending: Family Medicine | Admitting: Family Medicine

## 2015-01-17 DIAGNOSIS — M7671 Peroneal tendinitis, right leg: Secondary | ICD-10-CM

## 2015-01-17 LAB — CERVICOVAGINAL ANCILLARY ONLY: WET PREP (BD AFFIRM): POSITIVE — AB

## 2015-01-17 MED ORDER — NAPROXEN 500 MG PO TABS: 500.0000 mg | ORAL_TABLET | Freq: Two times a day (BID) | ORAL | Status: DC

## 2015-01-17 NOTE — ED Notes (Signed)
Right ankle pain and swelling.  No known injury.  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 Symptoms started last night, but today she hurts too bad to walk and stand.

## 2015-01-17 NOTE — Discharge Instructions (Signed)
It was a pleasure to see you today.   I am giving you a note out of work tomorrow, with restrictions of no weight bearing on right ankle for the remainder of the week.   Naproxen 500mg  tablets, take 1 tablet by mouth every 12 hours with something to eat.  Exercises to gently stretch the tendon periodically.  You may use a compression sleeve on the ankle as well.  Peroneal Tendinitis With Rehab Tendonitis is inflammation of a tendon. Inflammation of the tendons on the back of the outer ankle (peroneal tendons) is known as peroneal tendonitis. The peroneal tendons are responsible for connecting the muscles that allow you to stand on your tiptoes to the bones of the ankle. For this reason, peroneal tendonitis often causes pain when trying to complete such motions. Peroneal tendonitis often involves a tear (strain) of the peroneal tendons. Strains are classified into three categories. Grade 1 strains cause pain, but the tendon is not lengthened. Grade 2 strains include a lengthened ligament, due to the ligament being stretched or partially ruptured. With grade 2 strains there is still function, although function may be decreased. Grade 3 strains involve a complete tear of the tendon or muscle, and function is usually impaired. SYMPTOMS   Pain, tenderness, swelling, warmth, or redness over the back of the outer side of the ankle, the outer part of the mid-foot, or the bottom of the arch.  Pain that gets worse with ankle motion (especially when pushing off or pushing down with the front of the foot), or when standing on the ball of the foot or pushing the foot outward.  Crackling sound (crepitation) when the tendon is moved or touched. CAUSES  Peroneal tendinitis occurs when injury to the peroneal tendons causes the body to respond with inflammation. Common causes of injury include:  An overuse injury, in which the groove behind the outer ankle (where the tendon is located) causes wear on the  tendon.  A sudden stress placed on the tendon, such as from an increase in the intensity, frequency, or duration of training.  Direct hit (trauma) to the tendon.  Return to activity too soon after a previous ankle injury. RISK INCREASES WITH:  Sports that require sudden, repetitive pushing off of the foot, such as jumping or quick starts.  Kicking and running sports, especially running down hills or long distances.  Poor strength and flexibility.  Previous injury to the foot, ankle, or leg. PREVENTION  Warm up and stretch properly before activity.  Allow for adequate recovery between workouts.  Maintain physical fitness:  Strength, flexibility, and endurance.  Cardiovascular fitness.  Complete rehabilitation after previous injury. PROGNOSIS  If treated properly, peroneal tendonitis usually heals within 6 weeks.  RELATED COMPLICATIONS  Longer healing time, if not properly treated or if not given enough time to heal.  Recurring symptoms if activity is resumed too soon, with overuse, or when using poor technique.  If untreated, tendinitis may result in tendon rupture, requiring surgery. TREATMENT  Treatment first involves the use of ice and medicine to reduce pain and inflammation. The use of strengthening and stretching exercises may help reduce pain with activity. These exercises may be performed at unsuccessful, surgery to remove the inflamed tendon lining (sheath) may be advised.  MEDICATION   If pain medicine is needed, nonsteroidal anti-inflammatory medicines (aspirin and ibuprofen), or other minor pain relievers (acetaminophen), are often advised.  Do not take pain medicine for 7 days before surgery.  Prescription pain relievers may be  given, if your caregiver thinks they are needed. Use only as directed and only as much as you need. HEAT AND COLD  Cold treatment (icing) should be applied for 10 to 15 minutes every 2 to 3 hours for inflammation and pain, and  immediately after activity that aggravates your symptoms. Use ice packs or an ice massage.  Heat treatment may be used before performing stretching and strengthening activities prescribed by your caregiver, physical therapist, or athletic trainer. Use a heat pack or a warm water soak. SEEK MEDICAL CARE IF:  Symptoms get worse or do not improve in 2 to 4 weeks, despite treatment.  New, unexplained symptoms develop. (Drugs used in treatment may produce side effects.) EXERCISES RANGE OF MOTION (ROM) AND STRETCHING EXERCISES - Peroneal Tendinitis These exercises may help you when beginning to rehabilitate your injury. Your symptoms may resolve with or without further involvement from your physician, physical therapist or athletic trainer. While completing these exercises, remember:   Restoring tissue flexibility helps normal motion to return to the joints. This allows healthier, less painful movement and activity.  An effective stretch should be held for at least 30 seconds.  A stretch should never be painful. You should only feel a gentle lengthening or release in the stretched tissue. RANGE OF MOTION - Ankle Eversion  Sit with your right / left ankle crossed over your opposite knee.  Grip your foot with your opposite hand, placing your thumb on the top of your foot and your fingers across the bottom of your foot.  Gently push your foot downward with a slight rotation, so your littlest toes rise slightly toward the ceiling.  You should feel a gentle stretch on the inside of your ankle. Hold the stretch for __________ seconds. Repeat __________ times. Complete this exercise __________ times per day.  RANGE OF MOTION - Ankle Inversion  Sit with your right / left ankle crossed over your opposite knee.  Grip your foot with your opposite hand, placing your thumb on the bottom of your foot and your fingers across the top of your foot.  Gently pull your foot so the smallest toe comes toward  you and your thumb pushes the inside of the ball of your foot away from you.  You should feel a gentle stretch on the outside of your ankle. Hold the stretch for __________ seconds. Repeat __________ times. Complete this exercise __________ times per day.  RANGE OF MOTION - Ankle Plantar Flexion  Sit with your right / left leg crossed over your opposite knee.  Use your opposite hand to pull the top of your foot and toes toward you.  You should feel a gentle stretch on the top of your foot and ankle. Hold this position for __________ seconds. Repeat __________ times. Complete __________ times per day.  STRETCH - Gastroc, Standing  Place your hands on a wall.  Extend your right / left leg behind you, keeping the front knee somewhat bent.  Slightly point your toes inward on your back foot.  Keeping your right / left heel on the floor and your knee straight, shift your weight toward the wall, not allowing your back to arch.  You should feel a gentle stretch in the calf. Hold this position for __________ seconds. Repeat __________ times. Complete this stretch __________ times per day. STRETCH - Soleus, Standing  Place your hands on a wall.  Extend your right / left leg behind you, keeping the other knee somewhat bent.  Slightly point your toes  inward on your back foot.  Keep your heel on the floor, bend your back knee, and slightly shift your weight over the back leg so that you feel a gentle stretch deep in your back calf.  Hold this position for __________ seconds. Repeat __________ times. Complete this stretch __________ times per day. STRETCH - Gastrocsoleus, Standing Note: This exercise can place a lot of stress on your foot and ankle. Please complete this exercise only if specifically instructed by your caregiver.   Place the ball of your right / left foot on a step, keeping your other foot firmly on the same step.  Hold on to the wall or a rail for balance.  Slowly lift  your other foot, allowing your body weight to press your heel down over the edge of the step.  You should feel a stretch in your right / left calf.  Hold this position for __________ seconds.  Repeat this exercise with a slight bend in your knee. Repeat __________ times. Complete this stretch __________ times per day.  STRENGTHENING EXERCISES - Peroneal Tendinitis  These exercises may help you when beginning to rehabilitate your injury. They may resolve your symptoms with or without further involvement from your physician, physical therapist or athletic trainer. While completing these exercises, remember:   Muscles can gain both the endurance and the strength needed for everyday activities through controlled exercises.  Complete these exercises as instructed by your physician, physical therapist or athletic trainer. Increase the resistance and repetitions only as guided by your caregiver. STRENGTH - Dorsiflexors  Secure a rubber exercise band or tubing to a fixed object (table, pole) and loop the other end around your right / left foot.  Sit on the floor facing the fixed object. The band should be slightly tense when your foot is relaxed.  Slowly draw your foot back toward you, using your ankle and toes.  Hold this position for __________ seconds. Slowly release the tension in the band and return your foot to the starting position. Repeat __________ times. Complete this exercise __________ times per day.  STRENGTH - Towel Curls  Sit in a chair, on a non-carpeted surface.  Place your foot on a towel, keeping your heel on the floor.  Pull the towel toward your heel only by curling your toes. Keep your heel on the floor.  If instructed by your physician, physical therapist or athletic trainer, add weight to the end of the towel. Repeat __________ times. Complete this exercise __________ times per day. STRENGTH - Ankle Eversion   Secure one end of a rubber exercise band or tubing to a  fixed object (table, pole). Loop the other end around your foot, just before your toes.  Place your fists between your knees. This will focus your strengthening at your ankle.  Drawing the band across your opposite foot, away from the pole, slowly, pull your little toe out and up. Make sure the band is positioned to resist the entire motion.  Hold this position for __________ seconds.  Have your muscles resist the band, as it slowly pulls your foot back to the starting position. Repeat __________ times. Complete this exercise __________ times per day.    This information is not intended to replace advice given to you by your health care provider. Make sure you discuss any questions you have with your health care provider.   Document Released: 04/01/2005 Document Revised: 08/16/2014 Document Reviewed: 07/14/2008 Elsevier Interactive Patient Education [employer].

## 2015-01-17 NOTE — ED Provider Notes (Signed)
CSN: [id number]     Arrival date & time 01/17/15  1701 History   First MD Initiated Contact with Patient 01/17/15 1825     Chief Complaint  Patient presents with  . Ankle Pain   (Consider location/radiation/quality/duration/timing/severity/associated sxs/prior Treatment) Patient is a 39 y.o. female presenting with ankle pain. The history is provided by the patient. No language interpreter was used.  Ankle Pain Associated symptoms: no fatigue and no fever   Patient presents with complaint of right ankle pain, acute onset, non-traumatic. Works in a [place] where she has been standing on concrete floor with steel-toed shoes for 8-12 hours a day, has worked 12 straight days.  Works 4:30pm to 2:30am, when got off shift this morning could not bear weight on her right ankle.  Had difficulty plantar/dorsiflexing R foot to drive home. Has been taking ibuprofen 800mg  daily and using epsom salt soaks, elevation without relief. No prior R ankle trauma.     Past Medical History  Diagnosis Date  . Fibroids   . 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 Anemia   . Headache(784.0)   . Heart murmur     as a child  . Anxiety     no meds  . Hypertension    Past Surgical History  Procedure Laterality Date  . No past surgeries    . Supracervical abdominal hysterectomy N/A 11/25/2012    Procedure: HYSTERECTOMY SUPRACERVICAL ABDOMINAL;  Surgeon: Mora Bellman, MD;  Location: [facility];  Service: Gynecology;  Laterality: N/A;  . Abdominal hysterectomy     Family History  Problem Relation Age of Onset  . Diabetes Mother   . Hypertension Mother   . Hyperlipidemia Father   . Cancer Paternal Aunt   . Diabetes Maternal Grandmother   . Diabetes Paternal Grandmother    Social History  Substance Use Topics  . Smoking status: Never Smoker   . Smokeless tobacco: Never Used  . Alcohol Use: Yes     Comment: occasional   OB History    Gravida Para Term Preterm AB TAB SAB Ectopic Multiple Living   2 1 1  0 1 1 0  0 0 1     Review of Systems  Constitutional: Negative for fever, chills, diaphoresis, appetite change and fatigue.    Allergies  Latex  Home Medications   Prior to Admission medications   Medication Sig Start Date End Date Taking? Authorizing Provider  ALPRAZolam Duanne Moron) 0.5 MG tablet Take 1 tablet (0.5 mg total) by mouth 2 (two) times daily as needed for anxiety. 10/13/14   Lance Bosch, NP  cholecalciferol (VITAMIN D) 1000 UNITS tablet Take 1,000 Units by mouth daily.    Historical Provider, MD  clobetasol (TEMOVATE) 0.05 % external solution Apply 1 application topically 2 (two) times daily. 11/08/14   Lance Bosch, NP  cyclobenzaprine (FLEXERIL) 5 MG tablet Take 1 tablet (5 mg total) by mouth 2 (two) times daily as needed for muscle spasms. Patient not taking: Reported on 11/08/2014 10/13/14   Lance Bosch, NP  ketoconazole (NIZORAL) 2 % cream Apply 1 application topically daily. Under breast 11/08/14   Lance Bosch, NP  losartan (COZAAR) 50 MG tablet Take 1 tablet (50 mg total) by mouth daily. 08/25/14   Lance Bosch, NP  metroNIDAZOLE (FLAGYL) 500 MG tablet Take 1 tablet (500 mg total) by mouth 2 (two) times daily. X 7 days 01/14/15   Janne Napoleon, NP  Multiple Vitamins-Iron (MULTIVITAMINS WITH IRON)  TABS Take 1 tablet by mouth daily.    Historical Provider, MD   Meds Ordered and Administered this Visit  Medications - No data to display  BP 146/90 mmHg  Pulse 96  Temp(Src) 98.6 F (37 C) (Oral)  Resp 18  SpO2 99%  LMP 11/12/2012 No data found.   Physical Exam  Constitutional: She appears well-developed and well-nourished. No distress.  HENT:  Head: Normocephalic.  Neck: Neck supple.  Musculoskeletal:  No tenderness or swelling in calves, popliteal areas bilaterally.  Palpable dp pulses in both feet.   There is nonpitting edema with tenderness behind the lateral malleolus of the right foot. Limited dorsi/plantarflexion of right foot by pain.  No tenderness along  achilles tendon on right foot; no tenderness at insertion plantar fascia.  In wheelchair, not bearing weight.   Neurological: She is alert. She has normal reflexes. She displays normal reflexes. She exhibits normal muscle tone.  Sensation in toes in both feet full and symmetric. Brisk cap refill in toes of both feet.   Skin: She is not diaphoretic.    ED Course  Procedures (including critical care time)  Labs Review Labs Reviewed - No data to display  Imaging Review No results found.   Visual Acuity Review  Right Eye Distance:   Left Eye Distance:   Bilateral Distance:    Right Eye Near:   Left Eye Near:    Bilateral Near:         MDM   1. Peroneal tendinitis, right        Willeen Niece, MD 01/17/15 [phone]

## 2015-01-23 NOTE — ED Notes (Signed)
Final report of vaginal swab testing negative for STD. Positive for BV only, treatment adequate w Flagyl. No further action required

## 2015-01-25 ENCOUNTER — Telehealth (HOSPITAL_COMMUNITY): Payer: Self-pay | Admitting: Emergency Medicine

## 2015-01-25 NOTE — ED Notes (Signed)
Pt calling with complaints of yeast infection after taking antibiotics we prescribed her on 01/14/15.  Dr. Bridgett Larsson notified and order called into pharmacy as follows:  Diflucan 150mg  Dispense 2 tablets Take 1 today, then repeat dose in 3 days if symptoms are not gone.  Message left at [facility] on [address].

## 2015-02-24 ENCOUNTER — Other Ambulatory Visit: Payer: Self-pay | Admitting: Internal Medicine

## 2015-04-05 ENCOUNTER — Other Ambulatory Visit: Payer: Self-pay | Admitting: Internal Medicine

## 2015-04-17 MED FILL — LOSARTAN POTASSIUM 50 MG TA: 50 | 30 days supply | Qty: 30 | Fill #5

## 2015-05-22 MED FILL — LOSARTAN POTASSIUM 50 MG TA: 50 | 30 days supply | Qty: 30 | Fill #0

## 2015-06-23 MED FILL — LOSARTAN POTASSIUM 50 MG TA: 50 | 30 days supply | Qty: 30 | Fill #1

## 2015-06-26 ENCOUNTER — Ambulatory Visit: Payer: Self-pay | Attending: Internal Medicine | Admitting: Internal Medicine

## 2015-06-26 ENCOUNTER — Encounter: Payer: Self-pay | Admitting: Internal Medicine

## 2015-06-26 VITALS — BP 146/96 | HR 99 | Temp 98.0°F | Resp 16 | Ht 66.0 in | Wt 197.0 lb

## 2015-06-26 DIAGNOSIS — H6091 Unspecified otitis externa, right ear: Secondary | ICD-10-CM

## 2015-06-26 DIAGNOSIS — H6121 Impacted cerumen, right ear: Secondary | ICD-10-CM

## 2015-06-26 MED ORDER — NEOMYCIN-POLYMYXIN-HC 3.5-10000-1 OT SOLN: 4.0000 [drp] | Freq: Three times a day (TID) | OTIC | Status: DC

## 2015-06-26 MED FILL — CLOBETASOL 0.05% SOLUTION: 0.05 | 15 days supply | Qty: 50 | Fill #1

## 2015-06-26 MED FILL — NEO/POLYMYXIN/HC EAR SUSP: 3.5-10000-1 | 14 days supply | Qty: 10 | Fill #0

## 2015-06-26 NOTE — Progress Notes (Signed)
Patient complains of pain to her right ear for the past week Patient also states she has been having some sinus pressure as well

## 2015-06-26 NOTE — Patient Instructions (Addendum)
I will do a pelvic exam Friday to look around to see if we can find why you are bleeding  Debrox gtts

## 2015-06-26 NOTE — Progress Notes (Signed)
   Subjective:    Patient ID: Holly Valencia, female    DOB: 07-Sep-1975, 40 y.o.   MRN: [id number]  Otalgia  There is pain in the right ear. This is a new problem. The current episode started in the past 7 days. The problem has been gradually worsening. The pain is moderate (stabbing pain, feels like she is lossing hearing). Associated symptoms include headaches. Pertinent negatives include no coughing, rhinorrhea, sore throat or vomiting. Associated symptoms comments: Sinus pressure and decreased hearing . She has tried NSAIDs (claritin, flonase) for the symptoms. The treatment provided no relief.      Review of Systems  HENT: Positive for ear pain. Negative for rhinorrhea and sore throat.   Respiratory: Negative for cough.   Gastrointestinal: Negative for vomiting.  Neurological: Positive for headaches.  All other systems reviewed and are negative.      Objective:   Physical Exam  HENT:  Left Ear: External ear normal.  Right cerumen impaction. Erythematous inner ear canal, tender to manipulate tragus   Cardiovascular: Normal rate, regular rhythm and normal heart sounds.   Pulmonary/Chest: Effort normal and breath sounds normal.  Lymphadenopathy:    She has no cervical adenopathy.      Assessment & Plan:  Myrissa was seen today for ear pain.  Diagnoses and all orders for this visit:  Cerumen impaction, right -     Ear Lavage Large chunk of wax removed from ear. Hearing improved   Otitis externa, right -    Begin neomycin-polymyxin-hydrocortisone (CORTISPORIN) otic solution; Place 4 drops into the right ear 3 (three) times daily. She will return in 1 week for a recheck of ear. May use debrox to further remove wax from ear.   Return for Friday PCP ear check.  Lance Bosch, NP 06/28/2015 2:26 PM

## 2015-07-03 ENCOUNTER — Ambulatory Visit: Payer: Self-pay | Admitting: Internal Medicine

## 2015-07-12 ENCOUNTER — Ambulatory Visit: Payer: Self-pay | Admitting: Internal Medicine

## 2015-08-03 ENCOUNTER — Other Ambulatory Visit: Payer: Self-pay | Admitting: Internal Medicine

## 2015-08-03 NOTE — Telephone Encounter (Signed)
error 

## 2015-08-04 ENCOUNTER — Encounter: Payer: Self-pay | Admitting: Family Medicine

## 2015-08-04 ENCOUNTER — Ambulatory Visit: Payer: Self-pay | Attending: Family Medicine | Admitting: Family Medicine

## 2015-08-04 VITALS — BP 140/90 | HR 97 | Temp 98.5°F | Resp 18 | Ht 66.0 in | Wt 202.0 lb

## 2015-08-04 DIAGNOSIS — N951 Menopausal and female climacteric states: Secondary | ICD-10-CM | POA: Insufficient documentation

## 2015-08-04 DIAGNOSIS — Z0001 Encounter for general adult medical examination with abnormal findings: Secondary | ICD-10-CM | POA: Insufficient documentation

## 2015-08-04 DIAGNOSIS — G4700 Insomnia, unspecified: Secondary | ICD-10-CM | POA: Insufficient documentation

## 2015-08-04 DIAGNOSIS — I10 Essential (primary) hypertension: Secondary | ICD-10-CM | POA: Insufficient documentation

## 2015-08-04 DIAGNOSIS — E8941 Symptomatic postprocedural ovarian failure: Secondary | ICD-10-CM

## 2015-08-04 DIAGNOSIS — F419 Anxiety disorder, unspecified: Secondary | ICD-10-CM | POA: Insufficient documentation

## 2015-08-04 DIAGNOSIS — F064 Anxiety disorder due to known physiological condition: Secondary | ICD-10-CM | POA: Insufficient documentation

## 2015-08-04 DIAGNOSIS — R5383 Other fatigue: Secondary | ICD-10-CM

## 2015-08-04 DIAGNOSIS — Z79899 Other long term (current) drug therapy: Secondary | ICD-10-CM | POA: Insufficient documentation

## 2015-08-04 DIAGNOSIS — N958 Other specified menopausal and perimenopausal disorders: Secondary | ICD-10-CM

## 2015-08-04 MED ORDER — LOSARTAN POTASSIUM 50 MG PO TABS: 50.0000 mg | ORAL_TABLET | Freq: Every day | ORAL | Status: DC

## 2015-08-04 MED ORDER — CLONIDINE HCL 0.1 MG PO TABS: 0.1000 mg | ORAL_TABLET | Freq: Every day | ORAL | Status: DC

## 2015-08-04 MED ORDER — FLUOXETINE HCL 20 MG PO TABS: 20.0000 mg | ORAL_TABLET | Freq: Every day | ORAL | Status: DC

## 2015-08-04 MED FILL — ?CLONIDINE HCL 0.1 MG TABL: 0.1 | 30 days supply | Qty: 30 | Fill #0

## 2015-08-04 MED FILL — LOSARTAN POTASSIUM 50 MG TA: 50 | 30 days supply | Qty: 30 | Fill #0

## 2015-08-04 MED FILL — ?FLUOXETINE HCL 20MG TABLET: 20 | 30 days supply | Qty: 30 | Fill #0

## 2015-08-04 MED FILL — CLOBETASOL 0.05% SOLUTION: 0.05 | 15 days supply | Qty: 50 | Fill #2

## 2015-08-04 NOTE — Patient Instructions (Signed)
Generalized Anxiety Disorder Generalized anxiety disorder (GAD) is a mental disorder. It interferes with life functions, including relationships, work, and school. GAD is different from normal anxiety, which everyone experiences at some point in their lives in response to specific life events and activities. Normal anxiety actually helps us prepare for and get through these life events and activities. Normal anxiety goes away after the event or activity is over.  GAD causes anxiety that is not necessarily related to specific events or activities. It also causes excess anxiety in proportion to specific events or activities. The anxiety associated with GAD is also difficult to control. GAD can vary from mild to severe. People with severe GAD can have intense waves of anxiety with physical symptoms (panic attacks).  SYMPTOMS The anxiety and worry associated with GAD are difficult to control. This anxiety and worry are related to many life events and activities and also occur more days than not for 6 months or longer. People with GAD also have three or more of the following symptoms (one or more in children):  Restlessness.   Fatigue.  Difficulty concentrating.   Irritability.  Muscle tension.  Difficulty sleeping or unsatisfying sleep. DIAGNOSIS GAD is diagnosed through an assessment by your health care provider. Your health care provider will ask you questions aboutyour mood,physical symptoms, and events in your life. Your health care provider may ask you about your medical history and use of alcohol or drugs, including prescription medicines. Your health care provider may also do a physical exam and blood tests. Certain medical conditions and the use of certain substances can cause symptoms similar to those associated with GAD. Your health care provider may refer you to a mental health specialist for further evaluation. TREATMENT The following therapies are usually used to treat GAD:    Medication. Antidepressant medication usually is prescribed for long-term daily control. Antianxiety medicines may be added in severe cases, especially when panic attacks occur.   Talk therapy (psychotherapy). Certain types of talk therapy can be helpful in treating GAD by providing support, education, and guidance. A form of talk therapy called cognitive behavioral therapy can teach you healthy ways to think about and react to daily life events and activities.  Stress managementtechniques. These include yoga, meditation, and exercise and can be very helpful when they are practiced regularly. A mental health specialist can help determine which treatment is best for you. Some people see improvement with one therapy. However, other people require a combination of therapies.   This information is not intended to replace advice given to you by your health care provider. Make sure you discuss any questions you have with your health care provider.   Document Released: 07/27/2012 Document Revised: 04/22/2014 Document Reviewed: 07/27/2012 Elsevier Interactive Patient Education 2016 [employer]  

## 2015-08-04 NOTE — Progress Notes (Signed)
Subjective:  Patient ID: Holly Valencia, female    DOB: Dec 13, 1975  Age: 40 y.o. MRN: [id number]  CC: Vaginal Bleeding; Ear Pain; Establish Care; and Hypertension   HPI Holly Valencia is a 40 year old female with a history of Hypertension, Anxiety here for a follow up visit.  Has been compliant with antihypertensives She was treated for otitis last month and never came back for a recheck of her ears I would like them checked now; denies otalgia or sinus symptoms.  Complains of hot flashes and insomnia which are intermittent (had a hysterectomy 3 year ago due to fibroids).   Also job hunting and gets anxious; she is concerned she could loose everything including her home .  Requests a refill of Xanax which she last received in 09/2014 which calms her down when she gets anxious.  Denies Depression. She has noticed some abdominal cramping and ocassional bloating during times when she would usually have her periods; also noticed once a month when she wipes she sees a slight pink coloration on the tissue. Denies frank vaginal bleeding.   Outpatient Prescriptions Prior to Visit  Medication Sig Dispense Refill  . ALPRAZolam (XANAX) 0.5 MG tablet Take 1 tablet (0.5 mg total) by mouth 2 (two) times daily as needed for anxiety. 60 tablet 0  . cholecalciferol (VITAMIN D) 1000 UNITS tablet Take 1,000 Units by mouth daily.    . clobetasol (TEMOVATE) 0.05 % external solution APPLY 1 APPLICATION TOPICALLY 2 TIMES DAILY 50 mL 1  . ketoconazole (NIZORAL) 2 % cream Apply 1 application topically daily. Under breast 30 g 1  . Multiple Vitamins-Iron (MULTIVITAMINS WITH IRON) TABS Take 1 tablet by mouth daily.    Marland Kitchen losartan (COZAAR) 50 MG tablet TAKE 1 TABLET BY MOUTH DAILY. 30 tablet 2  . cyclobenzaprine (FLEXERIL) 5 MG tablet Take 1 tablet (5 mg total) by mouth 2 (two) times daily as needed for muscle spasms. (Patient not taking: Reported on 11/08/2014) 30 tablet 1  . metroNIDAZOLE (FLAGYL) 500 MG  tablet Take 1 tablet (500 mg total) by mouth 2 (two) times daily. X 7 days (Patient not taking: Reported on 08/04/2015) 14 tablet 0  . naproxen (NAPROSYN) 500 MG tablet Take 1 tablet (500 mg total) by mouth 2 (two) times daily. (Patient not taking: Reported on 08/04/2015) 30 tablet 0  . neomycin-polymyxin-hydrocortisone (CORTISPORIN) otic solution Place 4 drops into the right ear 3 (three) times daily. (Patient not taking: Reported on 08/04/2015) 10 mL 0   No facility-administered medications prior to visit.    ROS Review of Systems  Constitutional: Negative for activity change, appetite change and fatigue.  HENT: Negative for congestion, sinus pressure and sore throat.   Eyes: Negative for visual disturbance.  Respiratory: Negative for cough, chest tightness, shortness of breath and wheezing.   Cardiovascular: Negative for chest pain and palpitations.  Gastrointestinal: Negative for abdominal pain, constipation and abdominal distention.  Endocrine: Negative for polydipsia.  Genitourinary: Negative for dysuria and frequency.  Musculoskeletal: Negative for back pain and arthralgias.  Skin: Negative for rash.  Neurological: Negative for tremors, light-headedness and numbness.  Hematological: Does not bruise/bleed easily.  Psychiatric/Behavioral: Positive for sleep disturbance. Negative for behavioral problems and agitation. The patient is nervous/anxious.     Objective:  BP 140/90 mmHg  Pulse 97  Temp(Src) 98.5 F (36.9 C) (Oral)  Resp 18  Ht 5\' 6"  (1.676 m)  Wt 202 lb (91.627 kg)  BMI 32.62 kg/m2  SpO2 99%  LMP 11/12/2012  BP/Weight 08/04/2015 06/26/2015 123456  Systolic BP [id number] 123456 123456  Diastolic BP 90 96 90  Wt. (Lbs) 202 197 -  BMI 32.62 31.81 -      Physical Exam  Constitutional: She is oriented to person, place, and time. She appears well-developed and well-nourished.  Cardiovascular: Normal rate, normal heart sounds and intact distal pulses.   No murmur  heard. Pulmonary/Chest: Effort normal and breath sounds normal. She has no wheezes. She has no rales. She exhibits no tenderness.  Abdominal: Soft. Bowel sounds are normal. She exhibits no distension and no mass. There is no tenderness.  Musculoskeletal: Normal range of motion.  Neurological: She is alert and oriented to person, place, and time.     Assessment & Plan:   1. Essential hypertension Controlled Low-sodium, DASH diet - losartan (COZAAR) 50 MG tablet; Take 1 tablet (50 mg total) by mouth daily.  Dispense: 30 tablet; Refill: 3 - COMPLETE METABOLIC PANEL WITH GFR; Future - Lipid panel; Future  2. Anxiety Secondary to current stressors and job search Patient has been on Xanax intermittently and so I will place on SSRI; discussed addictive effects of Xanax Will reassess for improvement at next visit - FLUoxetine (PROZAC) 20 MG tablet; Take 1 tablet (20 mg total) by mouth daily.  Dispense: 30 tablet; Refill: 3  3. Insomnia Sleep hygiene discussed. Hopefully clonidine will help with insomnia  4. 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  Dispense: 30 tablet; Refill: 3  5. Other fatigue - TSH; Future - CBC with Differential/Platelet; Future - VITAMIN D 25 Hydroxy (Vit-D Deficiency, Fractures); Future   Meds ordered this encounter  Medications  . losartan (COZAAR) 50 MG tablet    Sig: Take 1 tablet (50 mg total) by mouth daily.    Dispense:  30 tablet    Refill:  3  . cloNIDine (CATAPRES) 0.1 MG tablet    Sig: Take 1 tablet (0.1 mg total) by mouth at bedtime.    Dispense:  30 tablet    Refill:  3  . FLUoxetine (PROZAC) 20 MG tablet    Sig: Take 1 tablet (20 mg total) by mouth daily.    Dispense:  30 tablet    Refill:  3    Follow-up: Return in about 3 years (around 08/04/2018) for Follow-up on anxiety.   Arnoldo Morale MD

## 2015-08-04 NOTE — Progress Notes (Signed)
Patient's here to est care with new provider. F/up HTN, vaginal exam.  Recheck her ears.  Patient c/o slight vaginal bleeding once a month, off and on that's noticeable when she wipes.  Patient requesting med refills. Xanax, and Losartan.

## 2015-08-08 ENCOUNTER — Ambulatory Visit: Payer: Self-pay | Attending: Internal Medicine

## 2015-08-08 DIAGNOSIS — R5383 Other fatigue: Secondary | ICD-10-CM | POA: Insufficient documentation

## 2015-08-08 DIAGNOSIS — I10 Essential (primary) hypertension: Secondary | ICD-10-CM

## 2015-08-08 LAB — CBC WITH DIFFERENTIAL/PLATELET
BASOS PCT: 0 %
Basophils Absolute: 0 cells/uL (ref 0–200)
EOS ABS: 56 {cells}/uL (ref 15–500)
Eosinophils Relative: 1 %
HCT: 40.7 % (ref 35.0–45.0)
Hemoglobin: 13.9 g/dL (ref 11.7–15.5)
LYMPHS PCT: 30 %
Lymphs Abs: 1680 cells/uL (ref 850–3900)
MCH: 29.2 pg (ref 27.0–33.0)
MCHC: 34.2 g/dL (ref 32.0–36.0)
MCV: 85.5 fL (ref 80.0–100.0)
MONOS PCT: 10 %
MPV: 9.8 fL (ref 7.5–12.5)
Monocytes Absolute: 560 cells/uL (ref 200–950)
Neutro Abs: 3304 cells/uL (ref 1500–7800)
Neutrophils Relative %: 59 %
PLATELETS: 267 10*3/uL (ref 140–400)
RBC: 4.76 MIL/uL (ref 3.80–5.10)
RDW: 14.6 % (ref 11.0–15.0)
WBC: 5.6 10*3/uL (ref 3.8–10.8)

## 2015-08-08 LAB — COMPLETE METABOLIC PANEL WITH GFR
ALBUMIN: 4 g/dL (ref 3.6–5.1)
ALK PHOS: 69 U/L (ref 33–115)
ALT: 11 U/L (ref 6–29)
AST: 15 U/L (ref 10–30)
BUN: 10 mg/dL (ref 7–25)
CALCIUM: 9.3 mg/dL (ref 8.6–10.2)
CO2: 25 mmol/L (ref 20–31)
Chloride: 105 mmol/L (ref 98–110)
Creat: 0.76 mg/dL (ref 0.50–1.10)
GFR, Est African American: >89 mL/min (ref >=60)
Glucose, Bld: 93 mg/dL (ref 65–99)
Potassium: 4.7 mmol/L (ref 3.5–5.3)
Sodium: 139 mmol/L (ref 135–146)
Total Bilirubin: 0.3 mg/dL (ref 0.2–1.2)
Total Protein: 7.6 g/dL (ref 6.1–8.1)

## 2015-08-08 LAB — LIPID PANEL
CHOLESTEROL: 185 mg/dL (ref 125–200)
HDL: 51 mg/dL (ref >=46)
LDL Cholesterol: 115 mg/dL (ref <130)
Total CHOL/HDL Ratio: 3.6 Ratio (ref <=5.0)
Triglycerides: 96 mg/dL (ref <150)
VLDL: 19 mg/dL (ref <30)

## 2015-08-08 LAB — TSH: TSH: 2.88 m[IU]/L

## 2015-08-08 NOTE — Progress Notes (Signed)
Pt is here for lab work only. 

## 2015-08-09 ENCOUNTER — Other Ambulatory Visit: Payer: Self-pay | Admitting: Family Medicine

## 2015-08-09 DIAGNOSIS — E559 Vitamin D deficiency, unspecified: Secondary | ICD-10-CM | POA: Insufficient documentation

## 2015-08-09 LAB — VITAMIN D 25 HYDROXY (VIT D DEFICIENCY, FRACTURES): Vit D, 25-Hydroxy: 13 ng/mL — ABNORMAL LOW (ref 30–100)

## 2015-08-09 MED ORDER — ERGOCALCIFEROL 1.25 MG (50000 UT) PO CAPS: 50000.0000 [IU] | ORAL_CAPSULE | ORAL | Status: DC

## 2015-08-16 ENCOUNTER — Telehealth: Payer: Self-pay

## 2015-08-16 NOTE — Telephone Encounter (Signed)
Placed call to patient, patient did not answer. Message was left for patient to call the office.

## 2015-08-16 NOTE — Telephone Encounter (Signed)
-----   Message from Arnoldo Morale, MD sent at 08/09/2015 11:34 PM EDT ----- Cholesterol, thyroid, blood count are normal; however vit D is low which could explain her fatigue and so i have sent a prescription for replacement to the pharmacy

## 2015-08-17 NOTE — Telephone Encounter (Signed)
Placed call to patient, patient verified name and DOB. Patient was given lab results verbalize understanding with no further questions. 

## 2015-09-04 MED FILL — VIT D2 1.25 MG (50,000 UNIT: 1.25 MG | 56 days supply | Qty: 9 | Fill #0

## 2015-09-04 MED FILL — CYCLOBENZAPRINE 5 MG TABLET: 5 | 15 days supply | Qty: 30 | Fill #1

## 2015-09-04 MED FILL — LOSARTAN POTASSIUM 50 MG TA: 50 | 30 days supply | Qty: 30 | Fill #1

## 2015-10-05 MED FILL — LOSARTAN POTASSIUM 50 MG TA: 50 | 30 days supply | Qty: 30 | Fill #2

## 2015-10-06 ENCOUNTER — Other Ambulatory Visit: Payer: Self-pay | Admitting: Internal Medicine

## 2015-10-06 MED FILL — ?CLONIDINE HCL 0.1 MG TABL: 0.1 | 30 days supply | Qty: 30 | Fill #1 | Status: TO

## 2015-10-21 ENCOUNTER — Ambulatory Visit (INDEPENDENT_AMBULATORY_CARE_PROVIDER_SITE_OTHER): Payer: BLUE CROSS/BLUE SHIELD | Admitting: Physician Assistant

## 2015-10-21 VITALS — BP 138/90 | HR 90 | Temp 98.7°F | Resp 18 | Ht 66.0 in | Wt 203.0 lb

## 2015-10-21 DIAGNOSIS — A499 Bacterial infection, unspecified: Secondary | ICD-10-CM | POA: Diagnosis not present

## 2015-10-21 DIAGNOSIS — L298 Other pruritus: Secondary | ICD-10-CM

## 2015-10-21 DIAGNOSIS — N898 Other specified noninflammatory disorders of vagina: Secondary | ICD-10-CM

## 2015-10-21 DIAGNOSIS — N760 Acute vaginitis: Secondary | ICD-10-CM

## 2015-10-21 DIAGNOSIS — B9689 Other specified bacterial agents as the cause of diseases classified elsewhere: Secondary | ICD-10-CM

## 2015-10-21 DIAGNOSIS — N9489 Other specified conditions associated with female genital organs and menstrual cycle: Secondary | ICD-10-CM | POA: Diagnosis not present

## 2015-10-21 LAB — POCT URINALYSIS DIP (MANUAL ENTRY)
Bilirubin, UA: NEGATIVE
Glucose, UA: NEGATIVE
Ketones, POC UA: NEGATIVE
Nitrite, UA: NEGATIVE
PH UA: 6.5
PROTEIN UA: NEGATIVE
SPEC GRAV UA: 1.02
UROBILINOGEN UA: 0.2

## 2015-10-21 LAB — POCT WET + KOH PREP
Trich by wet prep: ABSENT
YEAST BY WET PREP: ABSENT
Yeast by KOH: ABSENT

## 2015-10-21 LAB — POC MICROSCOPIC URINALYSIS (UMFC)

## 2015-10-21 MED ORDER — FLUCONAZOLE 150 MG PO TABS: 150.0000 mg | ORAL_TABLET | Freq: Once | ORAL | Status: DC

## 2015-10-21 MED ORDER — METRONIDAZOLE 500 MG PO TABS
500.0000 mg | ORAL_TABLET | Freq: Two times a day (BID) | ORAL | Status: DC
Start: 2015-10-21 — End: 2016-02-29

## 2015-10-21 NOTE — Patient Instructions (Signed)
     IF you received an x-ray today, you will receive an invoice from [facility]. Please contact [facility] at [phone] with questions or concerns regarding your invoice.   IF you received labwork today, you will receive an invoice from [facility]. Please contact [facility] at [phone] with questions or concerns regarding your invoice.   Our billing staff will not be able to assist you with questions regarding bills from these companies.  You will be contacted with the lab results as soon as they are available. The fastest way to get your results is to activate your My Chart account. Instructions are located on the last page of this paperwork. If you have not heard from us regarding the results in 2 weeks, please contact this office.      

## 2015-10-21 NOTE — Progress Notes (Signed)
Patient ID: Holly Valencia, female     DOB: 04-May-1975, 40 y.o.    MRN: [id number]  PCP: Lance Bosch, NP  Chief Complaint  Patient presents with  . Vaginitis    C/O vaginal irriation & odor x 2 weeks    Subjective:    HPI  Presents for evaluation of vaginal itching and odor x 2 weeks.  Has just started to develop a whitish vaginal discharge. 1 female monogamous sexual partner. Intermittent condom use due to latex allergy.  Describes a long history of recurrent BV and yeast infections. Previously used Boric Acid suppositories monthly.    Prior to Admission medications   Medication Sig Start Date End Date Taking? Authorizing Provider  cholecalciferol (VITAMIN D) 1000 UNITS tablet Take 1,000 Units by mouth daily.   Yes Historical Provider, MD  clobetasol (TEMOVATE) 0.05 % external solution APPLY 1 APPLICATION TOPICALLY 2 TIMES DAILY 10/06/15  Yes Tresa Garter, MD  cloNIDine (CATAPRES) 0.1 MG tablet Take 1 tablet (0.1 mg total) by mouth at bedtime. 08/04/15  Yes Arnoldo Morale, MD  ergocalciferol (DRISDOL) 50000 units capsule Take 1 capsule (50,000 Units total) by mouth once a week. 08/09/15  Yes Arnoldo Morale, MD  ketoconazole (NIZORAL) 2 % cream APPLY TOPICALLY DAILY UNDER BREAST 10/06/15  Yes Tresa Garter, MD  losartan (COZAAR) 50 MG tablet Take 1 tablet (50 mg total) by mouth daily. 08/04/15  Yes Arnoldo Morale, MD  Multiple Vitamins-Iron (MULTIVITAMINS WITH IRON) TABS Take 1 tablet by mouth daily.   Yes Historical Provider, MD  ALPRAZolam Duanne Moron) 0.5 MG tablet Take 1 tablet (0.5 mg total) by mouth 2 (two) times daily as needed for anxiety. Patient not taking: Reported on 10/21/2015 10/13/14   Lance Bosch, NP  FLUoxetine (PROZAC) 20 MG tablet Take 1 tablet (20 mg total) by mouth daily. Patient not taking: Reported on 10/21/2015 08/04/15   Arnoldo Morale, MD     Allergies  Allergen Reactions  . Latex Itching     Patient Active Problem List   Diagnosis Date  Noted  . Vitamin D deficiency 08/09/2015  . Anxiety 08/04/2015  . Insomnia 08/04/2015  . 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 HTN (hypertension) 10/13/2014  . Obesity 04/19/2014  . Elevated BP 04/19/2014  . DUB (dysfunctional uterine bleeding) 05/18/2012  . Fibroid uterus 03/22/2011     Family History  Problem Relation Age of Onset  . Diabetes Mother   . Hypertension Mother   . Hyperlipidemia Father   . Cancer Paternal Aunt   . Diabetes Maternal Grandmother   . Diabetes Paternal Grandmother      Social History   Social History  . Marital Status: Single    Spouse Name: N/A  . Number of Children: N/A  . Years of Education: N/A   Occupational History  . Not on file.   Social History Main Topics  . Smoking status: Never Smoker   . Smokeless tobacco: Never Used  . Alcohol Use: 0.0 oz/week    0 Standard drinks or equivalent per week     Comment: occasional  . Drug Use: No  . Sexual Activity:    Partners: Male    Birth Control/ Protection: Surgical     Comment: inconsistent condom use due Latex sensitivity          Review of Systems  Constitutional: Negative for fever and chills.  Gastrointestinal: Negative for nausea, vomiting and abdominal pain.  Genitourinary: Positive for vaginal discharge. Negative for  dysuria, urgency, frequency and hematuria.  Musculoskeletal: Negative for back pain.         Objective:  Physical Exam  Constitutional: She is oriented to person, place, and time. She appears well-developed and well-nourished. She is active and cooperative. No distress.  BP 138/90 mmHg  Pulse 90  Temp(Src) 98.7 F (37.1 C) (Oral)  Resp 18  Ht 5\' 6"  (1.676 m)  Wt 203 lb (92.08 kg)  BMI 32.78 kg/m2  SpO2 98%  LMP 11/12/2012   Eyes: Conjunctivae are normal.  Pulmonary/Chest: Effort normal.  Abdominal: Hernia confirmed negative in the right inguinal area and confirmed negative in the left inguinal area.  Genitourinary: Pelvic  exam was performed with patient supine. No labial fusion. There is no rash, tenderness, lesion or injury on the right labia. There is no rash, tenderness, lesion or injury on the left labia. Cervix exhibits no motion tenderness, no discharge and no friability. Right adnexum displays no mass, no tenderness and no fullness. Left adnexum displays no mass, no tenderness and no fullness. No erythema, tenderness or bleeding in the vagina. No foreign body around the vagina. No signs of injury around the vagina. Vaginal discharge (creamy, white discharge in the vaginal vault) found.  Lymphadenopathy:       Right: No inguinal adenopathy present.       Left: No inguinal adenopathy present.  Neurological: She is alert and oriented to person, place, and time.  Skin: Skin is warm and dry.  Psychiatric: She has a normal mood and affect. Her speech is normal and behavior is normal.        Results for orders placed or performed in visit on 10/21/15  POCT urinalysis dipstick  Result Value Ref Range   Color, UA yellow yellow   Clarity, UA hazy (A) clear   Glucose, UA negative negative   Bilirubin, UA negative negative   Ketones, POC UA negative negative   Spec Grav, UA 1.020    Blood, UA trace-lysed (A) negative   pH, UA 6.5    Protein Ur, POC negative negative   Urobilinogen, UA 0.2    Nitrite, UA Negative Negative   Leukocytes, UA moderate (2+) (A) Negative  POCT Microscopic Urinalysis ([facility])  Result Value Ref Range   WBC,UR,HPF,POC Many (A) None WBC/hpf   RBC,UR,HPF,POC None None RBC/hpf   Bacteria Few (A) None, Too numerous to count   Mucus Present (A) Absent   Epithelial Cells, UR Per Microscopy Moderate (A) None, Too numerous to count cells/hpf  POCT Wet + KOH Prep  Result Value Ref Range   Yeast by KOH Absent Present, Absent   Yeast by wet prep Absent Present, Absent   WBC by wet prep Few None, Few, Too numerous to count   Clue Cells Wet Prep HPF POC Moderate (A) None, Too numerous to  count   Trich by wet prep Absent Present, Absent   Bacteria Wet Prep HPF POC Moderate (A) None, Few, Too numerous to count   Epithelial Cells By [employer] Pref ([facility]) Few None, Few, Too numerous to count   RBC,UR,HPF,POC None None RBC/hpf        Assessment & Plan:  1. Vaginal itching 2. Vaginal odor - POCT urinalysis dipstick - POCT Microscopic Urinalysis ([facility]) - POCT Wet + KOH Prep  3. BV (bacterial vaginosis) Counseled regarding ways to reduce recurrent vaginal irritation, including trial of non-latex condoms. Consider resumption of boric acid suppositories, one weekly, to help maintain healthy vaginal flora. - metroNIDAZOLE (FLAGYL) 500 MG  tablet; Take 1 tablet (500 mg total) by mouth 2 (two) times daily with a meal. DO NOT CONSUME ALCOHOL WHILE TAKING THIS MEDICATION.  Dispense: 14 tablet; Refill: 0 - fluconazole (DIFLUCAN) 150 MG tablet; Take 1 tablet (150 mg total) by mouth once. Repeat if needed  Dispense: 2 tablet; Refill: 0   Fara Chute, PA-C Physician Assistant-Certified [facility]

## 2015-11-03 MED FILL — LOSARTAN POTASSIUM 50 MG TA: 50 | 30 days supply | Qty: 30 | Fill #0

## 2015-11-03 MED FILL — CLOBETASOL 0.05% SOLUTION: 0.05 | 30 days supply | Qty: 50 | Fill #0

## 2015-12-07 ENCOUNTER — Other Ambulatory Visit: Payer: Self-pay | Admitting: Internal Medicine

## 2015-12-07 MED FILL — LOSARTAN POTASSIUM 50 MG TA: 50 | 30 days supply | Qty: 30 | Fill #1

## 2015-12-07 MED FILL — CLOBETASOL 0.05% SOLUTION: 0.05 | 30 days supply | Qty: 50 | Fill #0

## 2016-01-08 MED FILL — LOSARTAN POTASSIUM 50 MG TA: 50 | 30 days supply | Qty: 30 | Fill #2

## 2016-02-12 MED FILL — LOSARTAN POTASSIUM 50 MG TA: 50 | 30 days supply | Qty: 30 | Fill #3

## 2016-02-29 ENCOUNTER — Ambulatory Visit: Payer: BLUE CROSS/BLUE SHIELD | Attending: Family Medicine | Admitting: Family Medicine

## 2016-02-29 ENCOUNTER — Telehealth: Payer: Self-pay | Admitting: Family Medicine

## 2016-02-29 ENCOUNTER — Encounter: Payer: Self-pay | Admitting: Family Medicine

## 2016-02-29 VITALS — HR 99 | Temp 99.0°F | Resp 20 | Ht 66.0 in | Wt 211.0 lb

## 2016-02-29 DIAGNOSIS — M25541 Pain in joints of right hand: Secondary | ICD-10-CM | POA: Insufficient documentation

## 2016-02-29 DIAGNOSIS — R5383 Other fatigue: Secondary | ICD-10-CM

## 2016-02-29 DIAGNOSIS — K5909 Other constipation: Secondary | ICD-10-CM | POA: Diagnosis not present

## 2016-02-29 DIAGNOSIS — M25542 Pain in joints of left hand: Secondary | ICD-10-CM | POA: Diagnosis not present

## 2016-02-29 DIAGNOSIS — F419 Anxiety disorder, unspecified: Secondary | ICD-10-CM | POA: Diagnosis not present

## 2016-02-29 DIAGNOSIS — M549 Dorsalgia, unspecified: Secondary | ICD-10-CM | POA: Diagnosis not present

## 2016-02-29 DIAGNOSIS — I10 Essential (primary) hypertension: Secondary | ICD-10-CM | POA: Diagnosis not present

## 2016-02-29 DIAGNOSIS — K5900 Constipation, unspecified: Secondary | ICD-10-CM | POA: Diagnosis not present

## 2016-02-29 MED ORDER — MELOXICAM 7.5 MG PO TABS: 7.5000 mg | ORAL_TABLET | Freq: Every day | ORAL | 0 refills | Status: DC

## 2016-02-29 MED ORDER — METHOCARBAMOL 750 MG PO TABS: 750.0000 mg | ORAL_TABLET | Freq: Two times a day (BID) | ORAL | 1 refills | Status: DC | PRN

## 2016-02-29 MED ORDER — LOSARTAN POTASSIUM 50 MG PO TABS
50.0000 mg | ORAL_TABLET | Freq: Every day | ORAL | 3 refills | Status: DC
Start: 2016-02-29 — End: 2016-08-25

## 2016-02-29 NOTE — Telephone Encounter (Signed)
Patient called requesting work note stating she was seen today. Please follow up with patient.

## 2016-02-29 NOTE — Progress Notes (Signed)
Subjective:  Patient ID: Holly Valencia, female    DOB: [birth year]  Age: 40 y.o. MRN: [id number]  CC: arthralgias  HPI Holly Valencia is a 40 year old female with a history of hypertension, anxiety who presents today for a follow-up visit.  She has been compliant with her antihypertensive and a low-sodium diet but has been unable to stick to an exercise regimen.  She complains of arthralgias in the knuckles of her hands and occasional numbness in the palms of her hands. At her job she has to wire pumps; symptoms have worsened over the last 3 weeks but she denies dropping of things from her hands. Also has back pain, neck pain and has been using a heating pad. 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 Has used magnesium citrate with some relief but is not sure if she can take this every day.  Complains of fatigue and daytime somnolence. States when she gets home from work all she wants to do is sleep. Review of her chart indicates a previous history of vitamin D deficiency; TSH from 07/2015 was normal   Past Medical History:  Diagnosis Date  . Allergy   . Anemia   . Anxiety    no meds  . Fibroids   . Headache(784.0)   . Heart murmur    as a child  . Hypertension   . Lupus    Of the Skin only    Past Surgical History:  Procedure Laterality Date  . ABDOMINAL HYSTERECTOMY    . NO PAST SURGERIES    . SUPRACERVICAL ABDOMINAL HYSTERECTOMY N/A 11/25/2012   Procedure: HYSTERECTOMY SUPRACERVICAL ABDOMINAL;  Surgeon: Mora Bellman, MD;  Location: [facility];  Service: Gynecology;  Laterality: N/A;    Allergies  Allergen Reactions  . Latex Itching     Outpatient Medications Prior to Visit  Medication Sig Dispense Refill  . cholecalciferol (VITAMIN D) 1000 UNITS tablet Take 1,000 Units by mouth daily.    . clobetasol (TEMOVATE) 0.05 % external solution APPLY 1 APPLICATION TOPICALLY 2 TIMES DAILY  50 mL 0  . cloNIDine (CATAPRES) 0.1 MG tablet Take 1 tablet (0.1 mg total) by mouth at bedtime. 30 tablet 3  . losartan (COZAAR) 50 MG tablet Take 1 tablet (50 mg total) by mouth daily. 30 tablet 3  . ergocalciferol (DRISDOL) 50000 units capsule Take 1 capsule (50,000 Units total) by mouth once a week. (Patient not taking: Reported on 02/29/2016) 9 capsule 0  . fluconazole (DIFLUCAN) 150 MG tablet Take 1 tablet (150 mg total) by mouth once. Repeat if needed 2 tablet 0  . FLUoxetine (PROZAC) 20 MG tablet Take 1 tablet (20 mg total) by mouth daily. (Patient not taking: Reported on 02/29/2016) 30 tablet 3  . Multiple Vitamins-Iron (MULTIVITAMINS WITH IRON) TABS Take 1 tablet by mouth daily.    Marland Kitchen ALPRAZolam (XANAX) 0.5 MG tablet Take 1 tablet (0.5 mg total) by mouth 2 (two) times daily as needed for anxiety. (Patient not taking: Reported on 10/21/2015) 60 tablet 0  . ketoconazole (NIZORAL) 2 % cream APPLY TOPICALLY DAILY UNDER BREAST (Patient not taking: Reported on 02/29/2016) 30 g 0  . metroNIDAZOLE (FLAGYL) 500 MG tablet Take 1 tablet (500 mg total) by mouth 2 (two) times daily with a meal. DO NOT CONSUME ALCOHOL WHILE TAKING THIS MEDICATION. 14 tablet 0   No facility-administered medications prior to visit.     ROS Review of Systems  Constitutional:  Positive for fatigue. Negative for activity change and appetite change.  HENT: Negative for congestion, sinus pressure and sore throat.   Eyes: Negative for visual disturbance.  Respiratory: Negative for cough, chest tightness, shortness of breath and wheezing.   Cardiovascular: Negative for chest pain and palpitations.  Gastrointestinal: Positive for constipation. Negative for abdominal distention and abdominal pain.  Endocrine: Negative for polydipsia.  Genitourinary: Negative for dysuria and frequency.  Musculoskeletal: Positive for arthralgias. Negative for back pain.  Skin: Negative for rash.  Neurological: Negative for tremors,  light-headedness and numbness.  Hematological: Does not bruise/bleed easily.  Psychiatric/Behavioral: Positive for sleep disturbance. Negative for agitation and behavioral problems.    Objective:  Pulse 99   Temp 99 F (37.2 C) (Oral)   Resp 20   Ht 5\' 6"  (1.676 m)   Wt 211 lb (95.7 kg)   LMP 11/12/2012   SpO2 99%   BMI 34.06 kg/m   BP/Weight 02/29/2016 10/21/2015 [id number]  Systolic BP - 0000000 [id number]  Diastolic BP - 90 90  Wt. (Lbs) 211 203 202  BMI 34.06 32.78 32.62      Physical Exam  Constitutional: She is oriented to person, place, and time. She appears well-developed and well-nourished.  Cardiovascular: Normal rate, normal heart sounds and intact distal pulses.   No murmur heard. Pulmonary/Chest: Effort normal and breath sounds normal. She has no wheezes. She has no rales. She exhibits no tenderness.  Abdominal: Soft. Bowel sounds are normal. She exhibits no distension and no mass. There is no tenderness.  Musculoskeletal: Normal range of motion. She exhibits tenderness (tenderness on palpation of trapezius and muscles on the back.). She exhibits no edema.  Neurological: She is alert and oriented to person, place, and time.  Skin: Skin is warm and dry.  Psychiatric: She has a normal mood and affect.     Assessment & Plan:   1. Essential hypertension Controlled - losartan (COZAAR) 50 MG tablet; Take 1 tablet (50 mg total) by mouth daily.  Dispense: 30 tablet; Refill: 3  2. Other fatigue Previously had Vit D Deficiency, will check again. - VITAMIN D 25 Hydroxy (Vit-D Deficiency, Fractures)  3. Arthralgia of both hands Will need to exclude Carpal tunnel syndrome due to numbness and repetitive hand movements - meloxicam (MOBIC) 7.5 MG tablet; Take 1 tablet (7.5 mg total) by mouth daily.  Dispense: 30 tablet; Refill: 0 - methocarbamol (ROBAXIN-750) 750 MG tablet; Take 1 tablet (750 mg total) by mouth 2 (two) times daily as needed for muscle spasms.  Dispense: 60 tablet;  Refill: 1  4. Other constipation Relieved by taking mag citrate Increase fiber intake  5. Back pain Use heating pad and muscle relaxant   Meds ordered this encounter  Medications  . losartan (COZAAR) 50 MG tablet    Sig: Take 1 tablet (50 mg total) by mouth daily.    Dispense:  30 tablet    Refill:  3  . meloxicam (MOBIC) 7.5 MG tablet    Sig: Take 1 tablet (7.5 mg total) by mouth daily.    Dispense:  30 tablet    Refill:  0  . methocarbamol (ROBAXIN-750) 750 MG tablet    Sig: Take 1 tablet (750 mg total) by mouth 2 (two) times daily as needed for muscle spasms.    Dispense:  60 tablet    Refill:  1    Follow-up: Return in about 1 month (around 03/30/2016) for follow up on arthralgias.   Arnoldo Morale MD

## 2016-02-29 NOTE — Progress Notes (Signed)
Generalized joint pain x 3 weeks. Numbness rt hand x 1 month and increasing. Job is wiring pumps constantly.  Also had neck pain. Denies HA's. Increased sleepiness. Last dose of Vitamin D was 2 months ago.  Also increased constipation. Been using laxative, stool softner and magnesium Citrate.

## 2016-03-01 ENCOUNTER — Telehealth: Payer: Self-pay | Admitting: Family Medicine

## 2016-03-01 ENCOUNTER — Other Ambulatory Visit: Payer: Self-pay | Admitting: Family Medicine

## 2016-03-01 ENCOUNTER — Telehealth: Payer: Self-pay

## 2016-03-01 DIAGNOSIS — E559 Vitamin D deficiency, unspecified: Secondary | ICD-10-CM

## 2016-03-01 LAB — VITAMIN D 25 HYDROXY (VIT D DEFICIENCY, FRACTURES): VIT D 25 HYDROXY: 19 ng/mL — AB (ref 30–100)

## 2016-03-01 MED ORDER — ERGOCALCIFEROL 1.25 MG (50000 UT) PO CAPS: 50000.0000 [IU] | ORAL_CAPSULE | ORAL | 0 refills | Status: DC

## 2016-03-01 NOTE — Telephone Encounter (Signed)
Writer called patient back to inquire whether she was going to pick up the requested work note or whether she wanted faxed or sent to her.  LVM asking for a return call with instructions.

## 2016-03-01 NOTE — Telephone Encounter (Signed)
Patient called stated got your message , and ok to mail letter.

## 2016-03-04 ENCOUNTER — Telehealth: Payer: Self-pay

## 2016-03-04 ENCOUNTER — Telehealth: Payer: Self-pay | Admitting: Family Medicine

## 2016-03-04 NOTE — Telephone Encounter (Signed)
Patient returned call..  Patient asked to be left a detail message on her voicemail regarding results.   DPR signed and scanned on documents authorizing release for results via voicemail

## 2016-03-04 NOTE — Telephone Encounter (Signed)
Letter mailed today.

## 2016-03-04 NOTE — Telephone Encounter (Signed)
-----   Message from Arnoldo Morale, MD sent at 03/01/2016  8:33 AM EST ----- Vitamin D is low which could explain her fatigue. Rx for Drisdol sent to pharmacy.

## 2016-03-04 NOTE — Telephone Encounter (Signed)
Writer called patient and LVM requesting a call back to discuss lab results.

## 2016-03-04 NOTE — Telephone Encounter (Signed)
Requested letter mailed to patient per her request.

## 2016-03-05 ENCOUNTER — Telehealth: Payer: Self-pay

## 2016-03-05 ENCOUNTER — Ambulatory Visit (INDEPENDENT_AMBULATORY_CARE_PROVIDER_SITE_OTHER): Payer: BLUE CROSS/BLUE SHIELD | Admitting: Physician Assistant

## 2016-03-05 VITALS — BP 128/80 | HR 105 | Temp 98.3°F | Resp 17 | Ht 66.5 in | Wt 213.0 lb

## 2016-03-05 DIAGNOSIS — L02429 Furuncle of limb, unspecified: Secondary | ICD-10-CM | POA: Diagnosis not present

## 2016-03-05 NOTE — Telephone Encounter (Signed)
Writer called and as requested by patient LVM with her lab results.  Encouraged patient to call with questions.

## 2016-03-05 NOTE — Progress Notes (Signed)
Holly Valencia  MRN: [id number] DOB: [birth year]  PCP: Arnoldo Morale, MD  Subjective:  Pt is a 40 year old female, PMH HTN, uterine fibroids, DUB, obesity, who presents to clinic for spot on thigh.  She noticed a pimple-like lesion on her upper inner right thigh late last week. Three days ago she applied warm compress to the area, once a day. The area grew bigger and she is concerned. She reports pain when she walks, as it rubs against her left thigh, also pain while washing it in the shower.  Denies drainage, fever, chills, redness, swelling.  History of lupus - has lupus skin lesions occasionally.   Review of Systems  Constitutional: Negative for chills, diaphoresis, fatigue and fever.  Respiratory: Negative for cough, chest tightness, shortness of breath and wheezing.   Cardiovascular: Negative for chest pain and palpitations.  Gastrointestinal: Negative for abdominal pain, diarrhea, nausea and vomiting.  Genitourinary: Negative for difficulty urinating, dysuria, flank pain, frequency, genital sores, urgency, vaginal bleeding, vaginal discharge and vaginal pain.  Musculoskeletal: Negative for arthralgias, gait problem and joint swelling.  Skin: Positive for wound.  Neurological: Negative for weakness, light-headedness and headaches.    Patient Active Problem List   Diagnosis Date Noted  . Vitamin D deficiency 08/09/2015  . Anxiety 08/04/2015  . Insomnia 08/04/2015  . 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 HTN (hypertension) 10/13/2014  . Obesity 04/19/2014  . Elevated BP 04/19/2014  . DUB (dysfunctional uterine bleeding) 05/18/2012  . Fibroid uterus 03/22/2011    Current Outpatient Prescriptions on File Prior to Visit  Medication Sig Dispense Refill  . clobetasol (TEMOVATE) 0.05 % external solution APPLY 1 APPLICATION TOPICALLY 2 TIMES DAILY 50 mL 0  . cloNIDine (CATAPRES) 0.1 MG tablet Take 1 tablet (0.1 mg total) by mouth at bedtime. 30 tablet 3  .  ergocalciferol (DRISDOL) 50000 units capsule Take 1 capsule (50,000 Units total) by mouth once a week. 9 capsule 0  . losartan (COZAAR) 50 MG tablet Take 1 tablet (50 mg total) by mouth daily. 30 tablet 3  . meloxicam (MOBIC) 7.5 MG tablet Take 1 tablet (7.5 mg total) by mouth daily. 30 tablet 0  . methocarbamol (ROBAXIN-750) 750 MG tablet Take 1 tablet (750 mg total) by mouth 2 (two) times daily as needed for muscle spasms. 60 tablet 1  . Multiple Vitamins-Iron (MULTIVITAMINS WITH IRON) TABS Take 1 tablet by mouth daily.    . [DISCONTINUED] losartan-hydrochlorothiazide (HYZAAR) 100-25 MG per tablet Take 1 tablet by mouth daily. 30 tablet 2   No current facility-administered medications on file prior to visit.     Allergies  Allergen Reactions  . Latex Itching     Objective:  BP 128/80 (BP Location: Right Arm, Patient Position: Sitting, Cuff Size: Normal)   Pulse (!) 105   Temp 98.3 F (36.8 C) (Oral)   Resp 17   Ht 5' 6.5" (1.689 m)   Wt 213 lb (96.6 kg)   LMP 11/12/2012   SpO2 99%   BMI 33.86 kg/m   Physical Exam  Constitutional: She is oriented to person, place, and time and well-developed, well-nourished, and in no distress. No distress.  Cardiovascular: Normal rate, regular rhythm and normal heart sounds.   Pulmonary/Chest: Effort normal. No respiratory distress.  Neurological: She is alert and oriented to person, place, and time. GCS score is 15.  Skin: Skin is warm and dry.     Psychiatric: Mood, memory, affect and judgment normal.  Vitals reviewed.  Assessment and Plan :  1. Furuncle of thigh - Supportive care: Discussed with patient use of warm compresses and keeping it covered while walking as the rubbing against her other thigh is causing her pain. She will RTC in a few days for possible I&D.  Mercer Pod, PA-C  [facility] 03/05/2016 5:22 PM

## 2016-03-05 NOTE — Patient Instructions (Signed)
     IF you received an x-ray today, you will receive an invoice from [facility]. Please contact [facility] at [phone] with questions or concerns regarding your invoice.   IF you received labwork today, you will receive an invoice from [facility]. Please contact [facility] at [phone] with questions or concerns regarding your invoice.   Our billing staff will not be able to assist you with questions regarding bills from these companies.  You will be contacted with the lab results as soon as they are available. The fastest way to get your results is to activate your My Chart account. Instructions are located on the last page of this paperwork. If you have not heard from us regarding the results in 2 weeks, please contact this office.      

## 2016-03-06 ENCOUNTER — Telehealth: Payer: Self-pay

## 2016-03-06 NOTE — Telephone Encounter (Signed)
-----   Message from Arnoldo Morale, MD sent at 03/01/2016  8:33 AM EST ----- Vitamin D is low which could explain her fatigue. Rx for Drisdol sent to pharmacy.

## 2016-03-06 NOTE — Telephone Encounter (Signed)
Patient received results through VM as requested.

## 2016-03-18 ENCOUNTER — Ambulatory Visit (INDEPENDENT_AMBULATORY_CARE_PROVIDER_SITE_OTHER): Payer: BLUE CROSS/BLUE SHIELD

## 2016-03-18 ENCOUNTER — Ambulatory Visit (INDEPENDENT_AMBULATORY_CARE_PROVIDER_SITE_OTHER): Payer: BLUE CROSS/BLUE SHIELD | Admitting: Family Medicine

## 2016-03-18 VITALS — BP 132/88 | HR 86 | Temp 98.9°F | Resp 16 | Ht 66.0 in | Wt 212.0 lb

## 2016-03-18 DIAGNOSIS — R1084 Generalized abdominal pain: Secondary | ICD-10-CM | POA: Diagnosis not present

## 2016-03-18 DIAGNOSIS — K5900 Constipation, unspecified: Secondary | ICD-10-CM | POA: Diagnosis not present

## 2016-03-18 DIAGNOSIS — N898 Other specified noninflammatory disorders of vagina: Secondary | ICD-10-CM | POA: Diagnosis not present

## 2016-03-18 LAB — POC MICROSCOPIC URINALYSIS (UMFC): MUCUS RE: ABSENT

## 2016-03-18 LAB — POCT WET + KOH PREP
TRICH BY WET PREP: ABSENT
YEAST BY KOH: ABSENT
YEAST BY WET PREP: ABSENT

## 2016-03-18 LAB — POCT URINALYSIS DIP (MANUAL ENTRY)
Bilirubin, UA: NEGATIVE
Blood, UA: NEGATIVE
GLUCOSE UA: NEGATIVE
Ketones, POC UA: NEGATIVE
Leukocytes, UA: NEGATIVE
Nitrite, UA: NEGATIVE
Protein Ur, POC: NEGATIVE
SPEC GRAV UA: 1.015
UROBILINOGEN UA: 0.2
pH, UA: 6

## 2016-03-18 MED ORDER — FLUCONAZOLE 150 MG PO TABS: 150.0000 mg | ORAL_TABLET | Freq: Once | ORAL | 0 refills | Status: AC

## 2016-03-18 NOTE — Patient Instructions (Addendum)
Take 150 mg of Diflucan for vaginal irritation.  Urine results were normal.  X-ray showed a large amount of stool. Try Miralax twice daily for until you can produce 4-5 loose stools to relieve stool burden.  IF you received an x-ray today, you will receive an invoice from [facility]. Please contact [facility] at [phone] with questions or concerns regarding your invoice.   IF you received labwork today, you will receive an invoice from [employer]. Please contact [facility] at [phone] with questions or concerns regarding your invoice.   Our billing staff will not be able to assist you with questions regarding bills from these companies.  You will be contacted with the lab results as soon as they are available. The fastest way to get your results is to activate your My Chart account. Instructions are located on the last page of this paperwork. If you have not heard from [place] regarding the results in 2 weeks, please contact this office.     Abdominal Pain, Adult Abdominal pain can be caused by many things. Often, abdominal pain is not serious and it gets better with no treatment or by being treated at home. However, sometimes abdominal pain is serious. Your health care provider will do a medical history and a physical exam to try to determine the cause of your abdominal pain. Follow these instructions at home:  Take over-the-counter and prescription medicines only as told by your health care provider. Do not take a laxative unless told by your health care provider.  Drink enough fluid to keep your urine clear or pale yellow.  Watch your condition for any changes.  Keep all follow-up visits as told by your health care provider. This is important. Contact a health care provider if:  Your abdominal pain changes or gets worse.  You are not hungry or you lose weight without trying.  You are constipated or have diarrhea for more than 2-3  days.  You have pain when you urinate or have a bowel movement.  Your abdominal pain wakes you up at night.  Your pain gets worse with meals, after eating, or with certain foods.  You are throwing up and cannot keep anything down.  You have a fever. Get help right away if:  Your pain does not go away as soon as your health care provider told you to expect.  You cannot stop throwing up.  Your pain is only in areas of the abdomen, such as the right side or the left lower portion of the abdomen.  You have bloody or black stools, or stools that look like tar.  You have severe pain, cramping, or bloating in your abdomen.  You have signs of dehydration, such as:  Dark urine, very little urine, or no urine.  Cracked lips.  Dry mouth.  Sunken eyes.  Sleepiness.  Weakness. This information is not intended to replace advice given to you by your health care provider. Make sure you discuss any questions you have with your health care provider. Document Released: 01/09/2005 Document Revised: 10/20/2015 Document Reviewed: 09/13/2015 Elsevier Interactive Patient Education  2017 [employer].

## 2016-03-18 NOTE — Progress Notes (Signed)
Patient ID: Holly Valencia, female    DOB: [birth year], 41 y.o.   MRN: [id number]  PCP: Arnoldo Morale, MD  Chief Complaint  Patient presents with  . Abdominal Pain    lower, x 2 week  . Vaginal Discharge    x 2 days     Subjective:   HPI 40 year old female presents for evaluation of abdominal pain and vaginal discharge.  Abdominal Pain Pt is familiar to [facility], although she presents newly to me as patient. Abdominal pain x two weeks . Regular bowel movement with intake of laxatives a couple times weekly. Reports a nagging aching pain in RLQ. Pain occurs regardless of food intake. Denies fever, chills, nausea or vomiting.  Reports good fluid intake.  Pain is aching with intermittent sharpness that is non-radiating.  Vaginal Discharge with Itching Denies pain with urination or low back pain. Denies urinary frequency. Starting eat spinach and baked to try and manage symptoms of constipation. Has to take laxatives weekly.  Review of Systems See HPI  Patient Active Problem List   Diagnosis Date Noted  . Vitamin D deficiency 08/09/2015  . Anxiety 08/04/2015  . Insomnia 08/04/2015  . 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 HTN (hypertension) 10/13/2014  . Obesity 04/19/2014  . Elevated BP 04/19/2014  . DUB (dysfunctional uterine bleeding) 05/18/2012  . Fibroid uterus 03/22/2011     Prior to Admission medications   Medication Sig Start Date End Date Taking? Authorizing Provider  clobetasol (TEMOVATE) 0.05 % external solution APPLY 1 APPLICATION TOPICALLY 2 TIMES DAILY 12/07/15  Yes Tresa Garter, MD  cloNIDine (CATAPRES) 0.1 MG tablet Take 1 tablet (0.1 mg total) by mouth at bedtime. 08/04/15  Yes Arnoldo Morale, MD  ergocalciferol (DRISDOL) 50000 units capsule Take 1 capsule (50,000 Units total) by mouth once a week. 03/01/16  Yes Arnoldo Morale, MD  losartan (COZAAR) 50 MG tablet Take 1 tablet (50 mg total) by mouth daily. 02/29/16  Yes Arnoldo Morale, MD    meloxicam (MOBIC) 7.5 MG tablet Take 1 tablet (7.5 mg total) by mouth daily. 02/29/16  Yes Arnoldo Morale, MD  methocarbamol (ROBAXIN-750) 750 MG tablet Take 1 tablet (750 mg total) by mouth 2 (two) times daily as needed for muscle spasms. 02/29/16  Yes Arnoldo Morale, MD  Multiple Vitamins-Iron (MULTIVITAMINS WITH IRON) TABS Take 1 tablet by mouth daily.   Yes Historical Provider, MD     Allergies  Allergen Reactions  . Latex Itching       Objective:  Physical Exam  Constitutional: She is oriented to person, place, and time. She appears well-developed and well-nourished.  HENT:  Head: Atraumatic.  Eyes: Conjunctivae are normal. Pupils are equal, round, and reactive to light.  Neck: Normal range of motion. Neck supple.  Cardiovascular: Normal rate.   Pulmonary/Chest: Effort normal.  Abdominal: She exhibits no distension and no mass. There is no tenderness. There is no rebound and no guarding.  Genitourinary:  Genitourinary Comments: Normal female external genitalia without lesion. No inguinal lymphadenopathy. Vaginal mucosa is pink and moist without lesions. Cervix is closed without discharge, not friable. No cervical motion tenderness. Wet Prep obtained.  Lymphadenopathy:    She has no cervical adenopathy.  Neurological: She is alert and oriented to person, place, and time.  Skin: Skin is warm and dry.    Vitals:   03/18/16 1801  BP: 132/88  Pulse: 86  Resp: 16  Temp: 98.9 F (37.2 C)     Dg Abd  2 Views  Result Date: 03/18/2016 CLINICAL DATA:  Generalized abdominal pain for 2 weeks. EXAM: ABDOMEN - 2 VIEW COMPARISON:  None. FINDINGS: The bowel gas pattern is normal. Moderate stool in the right colon. There is no evidence of free air. No radio-opaque calculi. The lung bases are clear. No acute osseous abnormality. IMPRESSION: Normal bowel gas pattern, moderate stool in the right colon. Electronically Signed   By: Jeb Levering M.D.   On: 03/18/2016 19:28   Assessment &  Plan:  1. Generalized abdominal pain, per imaging likely related to constipation. Plan: _Amb referral Gastroenterology 2. Constipation, unspecified constipation type, chronic  Plan: Take Miralax as directed daily until you can produce 4-5 loose stools to relieve stool burden.  3. Vaginal irritation Plan: -Take Fluconazole (Diflucan) 150 mg once for vaginal irritation.    Carroll Sage. Kenton Kingfisher, MSN, FNP-C [facility]

## 2016-03-29 ENCOUNTER — Ambulatory Visit: Payer: BLUE CROSS/BLUE SHIELD | Attending: Family Medicine | Admitting: Family Medicine

## 2016-03-29 ENCOUNTER — Other Ambulatory Visit: Payer: Self-pay | Admitting: Family Medicine

## 2016-03-29 ENCOUNTER — Encounter: Payer: Self-pay | Admitting: Family Medicine

## 2016-03-29 VITALS — BP 161/102 | HR 96 | Temp 98.1°F | Ht 66.0 in | Wt 212.0 lb

## 2016-03-29 DIAGNOSIS — K08409 Partial loss of teeth, unspecified cause, unspecified class: Secondary | ICD-10-CM | POA: Diagnosis not present

## 2016-03-29 DIAGNOSIS — B370 Candidal stomatitis: Secondary | ICD-10-CM | POA: Diagnosis not present

## 2016-03-29 DIAGNOSIS — I10 Essential (primary) hypertension: Secondary | ICD-10-CM | POA: Insufficient documentation

## 2016-03-29 MED ORDER — FLUCONAZOLE 150 MG PO TABS: 150.0000 mg | ORAL_TABLET | Freq: Every day | ORAL | 0 refills | Status: DC

## 2016-03-29 MED ORDER — CLOBETASOL PROPIONATE 0.05 % EX SOLN: CUTANEOUS | 1 refills | Status: DC

## 2016-03-29 NOTE — Progress Notes (Signed)
Subjective:  Patient ID: Holly Valencia, female    DOB: 1976-04-04  Age: 40 y.o. MRN: [id number]  CC: Dental Pain and Hypertension   HPI Holly Valencia is a 40 year old female with a history of hypertension who underwent a dental extraction 8 days ago and complains of pain at the site with associated burning inside her mouth; she also has burning in her throat when she swallows and has been using some baking soda solution to gargle. She got in touch with her oral surgeon who prescribed NSAIDs, Tylenol 3, Viscous Lidocaine along with Pen-V K which have not helped symptoms. Extraction was done on the general NSTs ear.  Her blood pressure is elevated today but has been controlled at her other previous visit. She denies fevers, chest pain.  Past Medical History:  Diagnosis Date  . Allergy   . Anemia   . Anxiety    no meds  . Fibroids   . Headache(784.0)   . Heart murmur    as a child  . Hypertension   . Lupus    Of the Skin only    Past Surgical History:  Procedure Laterality Date  . ABDOMINAL HYSTERECTOMY    . NO PAST SURGERIES    . SUPRACERVICAL ABDOMINAL HYSTERECTOMY N/A 11/25/2012   Procedure: HYSTERECTOMY SUPRACERVICAL ABDOMINAL;  Surgeon: Mora Bellman, MD;  Location: [facility];  Service: Gynecology;  Laterality: N/A;    Allergies  Allergen Reactions  . Latex Itching     Outpatient Medications Prior to Visit  Medication Sig Dispense Refill  . clobetasol (TEMOVATE) 0.05 % external solution APPLY 1 APPLICATION TOPICALLY 2 TIMES DAILY 50 mL 0  . cloNIDine (CATAPRES) 0.1 MG tablet Take 1 tablet (0.1 mg total) by mouth at bedtime. 30 tablet 3  . ergocalciferol (DRISDOL) 50000 units capsule Take 1 capsule (50,000 Units total) by mouth once a week. 9 capsule 0  . losartan (COZAAR) 50 MG tablet Take 1 tablet (50 mg total) by mouth daily. 30 tablet 3  . methocarbamol (ROBAXIN-750) 750 MG tablet Take 1 tablet (750 mg total) by mouth 2 (two) times daily as needed  for muscle spasms. 60 tablet 1  . Multiple Vitamins-Iron (MULTIVITAMINS WITH IRON) TABS Take 1 tablet by mouth daily.    . meloxicam (MOBIC) 7.5 MG tablet Take 1 tablet (7.5 mg total) by mouth daily. (Patient not taking: Reported on 03/29/2016) 30 tablet 0   No facility-administered medications prior to visit.     ROS Review of Systems  Constitutional: Negative for activity change and appetite change.  HENT: Positive for dental problem. Negative for sinus pressure and sore throat.   Respiratory: Negative for chest tightness, shortness of breath and wheezing.   Cardiovascular: Negative for chest pain and palpitations.  Gastrointestinal: Negative for abdominal distention, abdominal pain and constipation.  Genitourinary: Negative.   Musculoskeletal: Negative.   Psychiatric/Behavioral: Negative for behavioral problems and dysphoric mood.    Objective:  BP (!) 161/102 (BP Location: Right Arm, Patient Position: Sitting, Cuff Size: Small)   Pulse 96   Temp 98.1 F (36.7 C) (Oral)   Ht 5\' 6"  (1.676 m)   Wt 212 lb (96.2 kg)   LMP 11/12/2012   SpO2 99%   BMI 34.22 kg/m   BP/Weight 03/29/2016 03/18/2016 99991111  Systolic BP Q000111Q Q000111Q 0000000  Diastolic BP A999333 88 80  Wt. (Lbs) 212 212 213  BMI 34.22 34.22 33.86     Physical Exam  Constitutional: She is oriented to person, place, and  time. She appears well-developed and well-nourished.  HENT:  Site of extraction and posterior upper right jaw without evidence of abscess or infection. Surrounding whitish discoloration adjacent to extraction slide. Absence of aphthous ulcers in mouth or mucosa  Cardiovascular: Normal rate, normal heart sounds and intact distal pulses.   No murmur heard. Pulmonary/Chest: Effort normal and breath sounds normal. She has no wheezes. She has no rales. She exhibits no tenderness.  Abdominal: Soft. Bowel sounds are normal. She exhibits no distension and no mass. There is no tenderness.  Musculoskeletal: Normal  range of motion.  Neurological: She is alert and oriented to person, place, and time.     Assessment & Plan:   1. Oral candidiasis Placed on oral Diflucan  2. S/P tooth extraction Patient advised to full instructions on oral surgeon Continue Pen-V K, Viscous Lidocaine and narcotic analgesia sig.  3. Essential hypertension Elevated blood pressure which is unusual for her Elevation could be secondary to pain We'll reassess at next visit to determine need for change in therapy Low-sodium diet.   Meds ordered this encounter  Medications  . fluconazole (DIFLUCAN) 150 MG tablet    Sig: Take 1 tablet (150 mg total) by mouth daily.    Dispense:  5 tablet    Refill:  0    Follow-up: Return in about 1 month (around 04/29/2016) for follow up of hypertension.   Arnoldo Morale MD

## 2016-04-09 ENCOUNTER — Ambulatory Visit (INDEPENDENT_AMBULATORY_CARE_PROVIDER_SITE_OTHER): Payer: BLUE CROSS/BLUE SHIELD | Admitting: Physician Assistant

## 2016-04-09 VITALS — BP 148/82 | HR 102 | Temp 98.6°F | Resp 16 | Ht 66.0 in | Wt 211.6 lb

## 2016-04-09 DIAGNOSIS — Z299 Encounter for prophylactic measures, unspecified: Secondary | ICD-10-CM | POA: Diagnosis not present

## 2016-04-09 DIAGNOSIS — J069 Acute upper respiratory infection, unspecified: Secondary | ICD-10-CM | POA: Diagnosis not present

## 2016-04-09 MED ORDER — FLUCONAZOLE 150 MG PO TABS: 150.0000 mg | ORAL_TABLET | Freq: Once | ORAL | 0 refills | Status: AC

## 2016-04-09 MED ORDER — AMOXICILLIN 875 MG PO TABS: 875.0000 mg | ORAL_TABLET | Freq: Two times a day (BID) | ORAL | 0 refills | Status: DC

## 2016-04-09 NOTE — Patient Instructions (Signed)
     IF you received an x-ray today, you will receive an invoice from [facility]. Please contact [facility] at [phone] with questions or concerns regarding your invoice.   IF you received labwork today, you will receive an invoice from [facility]. Please contact [facility] at [phone] with questions or concerns regarding your invoice.   Our billing staff will not be able to assist you with questions regarding bills from these companies.  You will be contacted with the lab results as soon as they are available. The fastest way to get your results is to activate your My Chart account. Instructions are located on the last page of this paperwork. If you have not heard from us regarding the results in 2 weeks, please contact this office.     

## 2016-04-09 NOTE — Progress Notes (Signed)
04/09/2016 10:32 AM   DOB: 23-Sep-1975 / MRN: [id number]  SUBJECTIVE:  Holly Valencia is a 40 y.o. female presenting for nasal congestion that started about 6 days ago. She associates throat pain, ear pressure, chills, chest tightness and mild cough.  She deneis a history of asthma.  She denies fever. She denies a radicular pattern to the pain, SOB, DOE, leg swelling, diaphoresis, dizziness. She feels that she is getting worse.  She has tried netti pot, humidifier, Claritin, Theraflu, dayquil.  Does report some sinus pain. She reports getting yeast infections with abx therapy.    She is allergic to latex.   She  has a past medical history of Allergy; Anemia; Anxiety; Fibroids; Headache(784.0); Heart murmur; Hypertension; and Lupus.    She  reports that she has never smoked. She has never used smokeless tobacco. She reports that she drinks alcohol. She reports that she does not use drugs. She  reports that she currently engages in sexual activity and has had female partners. She reports using the following method of birth control/protection: Surgical. The patient  has a past surgical history that includes No past surgeries; Supracervical abdominal hysterectomy (N/A, 11/25/2012); and Abdominal hysterectomy.  Her family history includes Cancer in her paternal aunt; Diabetes in her maternal grandmother, mother, and paternal grandmother; Hyperlipidemia in her father; Hypertension in her mother.  Review of Systems  Constitutional: Negative for chills and fever.  Musculoskeletal: Negative for myalgias.  Skin: Negative for itching and rash.  Neurological: Negative for dizziness.    The problem list and medications were reviewed and updated by myself where necessary and exist elsewhere in the encounter.   OBJECTIVE:  BP (!) 148/82   Pulse (!) 102   Temp 98.6 F (37 C) (Oral)   Resp 16   Ht 5\' 6"  (1.676 m)   Wt 211 lb 9.6 oz (96 kg)   LMP 11/12/2012   SpO2 99%   BMI 34.15 kg/m    Physical Exam  Constitutional: She is oriented to person, place, and time. She appears well-developed and well-nourished.  HENT:  Right Ear: External ear normal.  Left Ear: External ear normal.  Nose: Nose normal.  Mouth/Throat: Oropharynx is clear and moist. No oropharyngeal exudate.  Cardiovascular: Normal rate, regular rhythm and normal heart sounds.   Pulmonary/Chest: Effort normal and breath sounds normal. She has no wheezes. She has no rales.  Neurological: She is alert and oriented to person, place, and time.  Skin: Skin is warm and dry.    Lab Results  Component Value Date   ALT 11 08/08/2015   AST 15 08/08/2015   ALKPHOS 69 08/08/2015   BILITOT 0.3 08/08/2015     Pulse Readings from Last 3 Encounters:  04/09/16 (!) 102  03/29/16 96  03/18/16 86     No results found for this or any previous visit (from the past 72 hour(s)).  No results found.  ASSESSMENT AND PLAN  Holly Valencia was seen today for sore throat, chest congestion, nasal congestion, cough and hoarse.  Diagnoses and all orders for this visit:  Acute URI Comments: She is worsening at day 6 and I would expect improvement by now. Will cover bacterial etiology Mildly tachy.  No leg swelling or calf pain.  Non smoker no OCP.  Orders: -     amoxicillin (AMOXIL) 875 MG tablet; Take 1 tablet (875 mg total) by mouth 2 (two) times daily. -     Care order/instruction:  Need for prophylactic measure -  fluconazole (DIFLUCAN) 150 MG tablet; Take 1 tablet (150 mg total) by mouth once. Repeat if needed    The patient is advised to call or return to clinic if she does not see an improvement in symptoms, or to seek the care of the closest emergency department if she worsens with the above plan.   Philis Fendt, MHS, PA-C [facility] 04/09/2016 10:32 AM

## 2016-04-11 ENCOUNTER — Encounter: Payer: Self-pay | Admitting: Physician Assistant

## 2016-04-11 ENCOUNTER — Ambulatory Visit (INDEPENDENT_AMBULATORY_CARE_PROVIDER_SITE_OTHER): Payer: BLUE CROSS/BLUE SHIELD | Admitting: Physician Assistant

## 2016-04-11 ENCOUNTER — Telehealth: Payer: Self-pay

## 2016-04-11 VITALS — BP 146/85 | HR 100 | Temp 98.3°F | Resp 18 | Ht 66.0 in | Wt 212.0 lb

## 2016-04-11 DIAGNOSIS — T7840XA Allergy, unspecified, initial encounter: Secondary | ICD-10-CM | POA: Diagnosis not present

## 2016-04-11 MED ORDER — CETIRIZINE HCL 10 MG PO TABS: 10.0000 mg | ORAL_TABLET | Freq: Every day | ORAL | 11 refills | Status: AC

## 2016-04-11 MED ORDER — HYDROXYZINE HCL 10 MG PO TABS: 10.0000 mg | ORAL_TABLET | Freq: Three times a day (TID) | ORAL | 0 refills | Status: DC | PRN

## 2016-04-11 NOTE — Telephone Encounter (Signed)
Pt calling about allergic reaction I transferred her through to julie to follow up

## 2016-04-11 NOTE — Progress Notes (Signed)
   04/11/2016 6:49 PM   DOB: 04-Nov-1975 / MRN: [id number]  SUBJECTIVE:  Holly Valencia is a 40 y.o. female presenting for rash on her chest that started 2 days ago.  She started taking amox 3 days ago for an acute upper respiratory infection.  Her last dose of amox was last night and she reports feeling somewhat itchy starting around noon today.  She denies lip, throat, tongue swelling.  The rash is not painful and there are no abrasions.  She has been taking benadryl and applying hydrocortisone cream and these have helped.    Review of Systems  Skin: Positive for itching and rash.    The problem list and medications were reviewed and updated by myself where necessary and exist elsewhere in the encounter.   OBJECTIVE:  BP (!) 146/85   Pulse 100   Temp 98.3 F (36.8 C) (Oral)   Resp 18   Ht 5\' 6"  (1.676 m)   Wt 212 lb (96.2 kg)   LMP 11/12/2012   SpO2 99%   BMI 34.22 kg/m   Pulse Readings from Last 3 Encounters:  04/11/16 100  04/09/16 (!) 102  03/29/16 96     Physical Exam  HENT:  Mouth/Throat: Uvula is midline, oropharynx is clear and moist and mucous membranes are normal.    Cardiovascular: Normal rate and regular rhythm.   Pulmonary/Chest: Effort normal.  Vitals reviewed.   No results found for this or any previous visit (from the past 72 hour(s)).  No results found.  ASSESSMENT AND PLAN  Tianca was seen today for urticaria.  Diagnoses and all orders for this visit:  Allergic reaction, initial encounter: Allergy to amox updated in [facility] and she has stopped this.  No alarm features today. Certirizine and atarax for itching.  Advised benadryl is also okay if itching.  She will come back tomorrow if she is worse.  -     cetirizine (ZYRTEC) 10 MG tablet; Take 1 tablet (10 mg total) by mouth daily. -     hydrOXYzine (ATARAX/VISTARIL) 10 MG tablet; Take 1 tablet (10 mg total) by mouth 3 (three) times daily as needed.    The patient is advised to call or  return to clinic if she does not see an improvement in symptoms, or to seek the care of the closest emergency department if she worsens with the above plan.   Philis Fendt, MHS, PA-C [facility] 04/11/2016 6:49 PM

## 2016-04-11 NOTE — Telephone Encounter (Signed)
Talked to [place] and is coming in today to see michael . Rash , no other sx's, stopped antibiotic

## 2016-04-11 NOTE — Patient Instructions (Addendum)
Do not take anymore amoxicillin.  Take zyrtec every 24 hours and take hydroxazine every 6-8 hours as needed for itching.  Okay to continue taking benadryl as well.  Come back tomorrow if your are getting worse.  If you are not feeling better within 24 hours please return to our office. Go to the hospital if your lips, throat and tongue begin to swell.      IF you received an x-ray today, you will receive an invoice from [facility]. Please contact [facility] at [phone] with questions or concerns regarding your invoice.   IF you received labwork today, you will receive an invoice from [facility]. Please contact [facility] at [phone] with questions or concerns regarding your invoice.   Our billing staff will not be able to assist you with questions regarding bills from these companies.  You will be contacted with the lab results as soon as they are available. The fastest way to get your results is to activate your My Chart account. Instructions are located on the last page of this paperwork. If you have not heard from [place] regarding the results in 2 weeks, please contact this office.

## 2016-04-13 ENCOUNTER — Telehealth: Payer: Self-pay

## 2016-04-13 NOTE — Telephone Encounter (Signed)
Rash is spreading and pt is not sure wht to do next   Best number [phone]

## 2016-04-15 ENCOUNTER — Emergency Department (HOSPITAL_BASED_OUTPATIENT_CLINIC_OR_DEPARTMENT_OTHER)
Admission: EM | Admit: 2016-04-15 | Discharge: 2016-04-15 | Disposition: A | Discharge status: Home / Self Care | Payer: BLUE CROSS/BLUE SHIELD | Attending: Emergency Medicine | Admitting: Emergency Medicine

## 2016-04-15 ENCOUNTER — Encounter (HOSPITAL_BASED_OUTPATIENT_CLINIC_OR_DEPARTMENT_OTHER): Payer: Self-pay | Admitting: *Deleted

## 2016-04-15 DIAGNOSIS — Z91040 Latex allergy status: Secondary | ICD-10-CM | POA: Insufficient documentation

## 2016-04-15 DIAGNOSIS — T7840XA Allergy, unspecified, initial encounter: Secondary | ICD-10-CM

## 2016-04-15 DIAGNOSIS — I10 Essential (primary) hypertension: Secondary | ICD-10-CM | POA: Diagnosis not present

## 2016-04-15 DIAGNOSIS — L500 Allergic urticaria: Secondary | ICD-10-CM | POA: Insufficient documentation

## 2016-04-15 DIAGNOSIS — Z79899 Other long term (current) drug therapy: Secondary | ICD-10-CM | POA: Diagnosis not present

## 2016-04-15 DIAGNOSIS — L509 Urticaria, unspecified: Secondary | ICD-10-CM

## 2016-04-15 DIAGNOSIS — R21 Rash and other nonspecific skin eruption: Secondary | ICD-10-CM | POA: Diagnosis present

## 2016-04-15 MED ORDER — PREDNISONE 10 MG PO TABS: 50.0000 mg | ORAL_TABLET | Freq: Every day | ORAL | 0 refills | Status: DC

## 2016-04-15 MED ORDER — FAMOTIDINE 20 MG PO TABS: 20.0000 mg | ORAL_TABLET | Freq: Every day | ORAL | 0 refills | Status: DC

## 2016-04-15 NOTE — ED Provider Notes (Signed)
[facility] Provider Note   CSN: [id number] Arrival date & time: 04/15/16  1727 By signing my name below, I, Holly Valencia, attest that this documentation has been prepared under the direction and in the presence of Holly Dandy, MD . Electronically Signed: Dyke Valencia, Scribe. 04/15/2016. 8:17 PM.   History   Chief Complaint Chief Complaint  Patient presents with  . Rash    HPI Holly Valencia is a 41 y.o. female who presents to the Emergency Department complaining of persistent, progressively worsening, itching diffuse rash onset one week ago. Pt had an allergic reaction one week ago to amoxicillin and was Rx atarax and zyrtec. She has taken these and benadryl with no relief. No associated symptoms noted. She denies SOB, nausea, vomiting, difficulty swallowing, abdominal pain, or oral lesions. No fever or chills. Stopped taking amoxicillin.  The history is provided by the patient. No language interpreter was used.    Past Medical History:  Diagnosis Date  . Allergy   . Anemia   . Anxiety    no meds  . Fibroids   . Headache(784.0)   . Heart murmur    as a child  . Hypertension   . Lupus    Of the Skin only    Patient Active Problem List   Diagnosis Date Noted  . Vitamin D deficiency 08/09/2015  . Anxiety 08/04/2015  . Insomnia 08/04/2015  . 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 HTN (hypertension) 10/13/2014  . Obesity 04/19/2014  . Elevated BP 04/19/2014  . DUB (dysfunctional uterine bleeding) 05/18/2012  . Fibroid uterus 03/22/2011    Past Surgical History:  Procedure Laterality Date  . ABDOMINAL HYSTERECTOMY    . NO PAST SURGERIES    . SUPRACERVICAL ABDOMINAL HYSTERECTOMY N/A 11/25/2012   Procedure: HYSTERECTOMY SUPRACERVICAL ABDOMINAL;  Surgeon: Mora Bellman, MD;  Location: [facility];  Service: Gynecology;  Laterality: N/A;    OB History    Gravida Para Term Preterm AB Living   2 1 1  0 1 1   SAB TAB Ectopic Multiple Live  Births   0 1 0 0        Home Medications    Prior to Admission medications   Medication Sig Start Date End Date Taking? Authorizing Provider  acetaminophen-codeine (TYLENOL #3) 300-30 MG tablet Take 2 tablets by mouth every 4 (four) hours as needed for moderate pain.    Historical Provider, MD  cetirizine (ZYRTEC) 10 MG tablet Take 1 tablet (10 mg total) by mouth daily. 04/11/16   Tereasa Coop, PA-C  clobetasol (TEMOVATE) 0.05 % external solution APPLY 1 APPLICATION TOPICALLY 2 TIMES DAILY 03/29/16   Arnoldo Morale, MD  cloNIDine (CATAPRES) 0.1 MG tablet Take 1 tablet (0.1 mg total) by mouth at bedtime. 08/04/15   Arnoldo Morale, MD  ergocalciferol (DRISDOL) 50000 units capsule Take 1 capsule (50,000 Units total) by mouth once a week. 03/01/16   Arnoldo Morale, MD  famotidine (PEPCID) 20 MG tablet Take 1 tablet (20 mg total) by mouth daily. 04/15/16   Holly Dandy, MD  HYDROcodone-acetaminophen (NORCO/VICODIN) 5-325 MG tablet Take 1 tablet by mouth every 6 (six) hours as needed for moderate pain.    Historical Provider, MD  hydrOXYzine (ATARAX/VISTARIL) 10 MG tablet Take 1 tablet (10 mg total) by mouth 3 (three) times daily as needed. 04/11/16   Tereasa Coop, PA-C  losartan (COZAAR) 50 MG tablet Take 1 tablet (50 mg total) by mouth daily. 02/29/16   Enobong  Jarold Song, MD  methocarbamol (ROBAXIN-750) 750 MG tablet Take 1 tablet (750 mg total) by mouth 2 (two) times daily as needed for muscle spasms. 02/29/16   Arnoldo Morale, MD  Multiple Vitamins-Iron (MULTIVITAMINS WITH IRON) TABS Take 1 tablet by mouth daily.    Historical Provider, MD  oxyCODONE-acetaminophen (PERCOCET/ROXICET) 5-325 MG tablet Take 1 tablet by mouth every 4 (four) hours as needed for severe pain.    Historical Provider, MD  predniSONE (DELTASONE) 10 MG tablet Take 5 tablets (50 mg total) by mouth daily. 04/15/16   Holly Dandy, MD    Family History Family History  Problem Relation Age of Onset  . Diabetes Mother   . Hypertension  Mother   . Hyperlipidemia Father   . Cancer Paternal Aunt   . Diabetes Maternal Grandmother   . Diabetes Paternal Grandmother     Social History Social History  Substance Use Topics  . Smoking status: Never Smoker  . Smokeless tobacco: Never Used  . Alcohol use 0.0 oz/week     Comment: occasional    Allergies   Amoxicillin; Latex; and Penicillins   Review of Systems Review of Systems 10 systems reviewed and all are negative for acute change except as noted in the HPI.   Physical Exam Updated Vital Signs BP 161/89 (BP Location: Left Arm)   Pulse 108   Temp 98.3 F (36.8 C)   Resp 18   Ht 5\' 6"  (1.676 m)   Wt 212 lb (96.2 kg)   LMP 11/12/2012   SpO2 100%   BMI 34.22 kg/m   Physical Exam Physical Exam  Nursing note and vitals reviewed. Constitutional: Well developed, well nourished, non-toxic, and in no acute distress Head: Normocephalic and atraumatic.  Mouth/Throat: Oropharynx is clear and moist.  Neck: Normal range of motion. Neck supple.  Cardiovascular: Normal rate and regular rhythm.   Pulmonary/Chest: Effort normal and breath sounds normal.  Abdominal: Soft. There is no tenderness. There is no rebound and no guarding.  Musculoskeletal: Normal range of motion.  Neurological: Alert, no facial droop, fluent speech, moves all extremities symmetrically Skin: Skin is warm and dry. Urticarial rash over chest wall and abdominal wall.  Psychiatric: Cooperative   ED Treatments / Results  DIAGNOSTIC STUDIES:  Oxygen Saturation is 100% on RA, normal by my interpretation.    COORDINATION OF CARE:  8:11 PM Discussed treatment plan with pt at bedside and pt agreed to plan.   Labs (all labs ordered are listed, but only abnormal results are displayed) Labs Reviewed - No data to display  EKG  EKG Interpretation None       Radiology No results found.  Procedures Procedures (including critical care time)  Medications Ordered in ED Medications - No data  to display   Initial Impression / Assessment and Plan / ED Course  I have reviewed the triage vital signs and the nursing notes.  Pertinent labs & imaging results that were available during my care of the patient were reviewed by me and considered in my medical decision making (see chart for details).  Clinical Course    Presenting with persistent rash in the setting of recent antibiotics. A stop taking amoxicillin. Now with urticaria involving the upper chest wall and abdominal wall. Vital signs stable, she is well-appearing, no signs of anaphylaxis. No oral lesions or mucous membrane involvement. We'll start a course of steroids and she will continue antihistamines. Strict return and follow-up instructions reviewed. She expressed understanding of all discharge instructions and felt  comfortable with the plan of care.    Final Clinical Impressions(s) / ED Diagnoses   Final diagnoses:  Urticaria  Hives  Allergic reaction, initial encounter    New Prescriptions New Prescriptions   FAMOTIDINE (PEPCID) 20 MG TABLET    Take 1 tablet (20 mg total) by mouth daily.   PREDNISONE (DELTASONE) 10 MG TABLET    Take 5 tablets (50 mg total) by mouth daily.   I personally performed the services described in this documentation, which was scribed in my presence. The recorded information has been reviewed and is accurate.    Holly Dandy, MD 04/15/16 2024

## 2016-04-15 NOTE — ED Notes (Signed)
ED Provider at bedside. 

## 2016-04-15 NOTE — ED Triage Notes (Signed)
Pt DX allergic  reaction x 1 week ago from ABX rash continues

## 2016-04-15 NOTE — Discharge Instructions (Signed)
Continue to take zyrtec. Take benadryl 50 mg every 6 hours for hives and itching. Take steroids and pepcid as prescribed.   Return for worsening symptoms, including fever, worsening rash, lesions in the mouth, swelling of mouth or throat, difficulty breathing, or any other symptoms concerning to you.

## 2016-04-16 NOTE — Telephone Encounter (Signed)
Patient went to ER 04/15/16

## 2016-04-22 ENCOUNTER — Other Ambulatory Visit: Payer: Self-pay | Admitting: Family Medicine

## 2016-04-22 DIAGNOSIS — E559 Vitamin D deficiency, unspecified: Secondary | ICD-10-CM

## 2016-05-17 ENCOUNTER — Other Ambulatory Visit: Payer: Self-pay | Admitting: Pharmacist

## 2016-05-17 DIAGNOSIS — E8941 Symptomatic postprocedural ovarian failure: Secondary | ICD-10-CM

## 2016-05-17 MED ORDER — CLONIDINE HCL 0.1 MG PO TABS: 0.1000 mg | ORAL_TABLET | Freq: Every day | ORAL | 0 refills | Status: DC

## 2016-05-20 ENCOUNTER — Encounter: Payer: Self-pay | Admitting: Family Medicine

## 2016-06-12 ENCOUNTER — Other Ambulatory Visit: Payer: Self-pay | Admitting: Family Medicine

## 2016-06-25 ENCOUNTER — Encounter: Payer: Self-pay | Admitting: Physician Assistant

## 2016-07-05 ENCOUNTER — Encounter (INDEPENDENT_AMBULATORY_CARE_PROVIDER_SITE_OTHER): Payer: Self-pay

## 2016-07-05 ENCOUNTER — Ambulatory Visit (INDEPENDENT_AMBULATORY_CARE_PROVIDER_SITE_OTHER): Payer: BLUE CROSS/BLUE SHIELD | Admitting: Physician Assistant

## 2016-07-05 ENCOUNTER — Encounter: Payer: Self-pay | Admitting: Physician Assistant

## 2016-07-05 VITALS — BP 118/76 | HR 70 | Ht 66.0 in | Wt 219.6 lb

## 2016-07-05 DIAGNOSIS — K5900 Constipation, unspecified: Secondary | ICD-10-CM

## 2016-07-05 MED ORDER — POLYETHYLENE GLYCOL 3350 17 GM/SCOOP PO POWD: 17.0000 g | Freq: Every day | ORAL | 3 refills | Status: DC

## 2016-07-05 NOTE — Patient Instructions (Addendum)
We have given you a high fiber diet handout. Please strive to have 25-30 grams of fiber daily.   Your provider suggest that you drink more water. Try to have at least 6-8 8 oz glasses of water daily.   We have sent the following medications to your pharmacy for you to pick up at your convenience: Miralax 17 grams daily in 8 Oz daily of water or juice.   Please purchase the following medications over the counter and take as directed: Align once daily.

## 2016-07-05 NOTE — Progress Notes (Signed)
Chief Complaint: Constipation  HPI:  Holly Valencia is a 41 year old African-American female with past medical history of anxiety, hypertension and others listed below who was referred to me by Arnoldo Morale, MD for a complaint of constipation.     Today, the patient tells me that she has been constipated for as long as she can remember. She remembers being "a little girl and my mother would call me in to have an enema". She describes that she may only have a bowel movement every -5 days. About a year ago she started becoming somewhat nauseous and started taking Gas-X which seem to "relieve my nausea", but then recently started with lower abdominal pain and bloating related to her constipation. She has started MiraLAX which she tries to take once a day. As long as she does that, she does maintain daily bowel movements, but if she forgets even one dose she will have a back up of stool and sometimes uses enemas and/or other "natural laxatives", in order to have a good bowel movement. The patient tells me that she has tried to increase fiber in her diet but "I am allergic to a lot of fruits and vegetables", so this has been hard for her to do. She does tell me that she "doesn't drink enough water". The patient does not want to start another "pill".   Patient's family history is positive for colon cancer in her great grandmother, diagnosed in her 73s.   Patient denies fever, chills, blood in her stool, melena, weight loss, fatigue, anorexia, change in diet, vomiting or constant abdominal pain.  Past Medical History:  Diagnosis Date  . Allergy   . Anemia   . Anxiety    no meds  . Fibroids   . Headache(784.0)   . Heart murmur    as a child  . Hypertension   . Lupus    Of the Skin only    Past Surgical History:  Procedure Laterality Date  . ABDOMINAL HYSTERECTOMY    . NO PAST SURGERIES    . SUPRACERVICAL ABDOMINAL HYSTERECTOMY N/A 11/25/2012   Procedure: HYSTERECTOMY SUPRACERVICAL ABDOMINAL;   Surgeon: Mora Bellman, MD;  Location: [facility];  Service: Gynecology;  Laterality: N/A;    Current Outpatient Prescriptions  Medication Sig Dispense Refill  . cetirizine (ZYRTEC) 10 MG tablet Take 1 tablet (10 mg total) by mouth daily. 30 tablet 11  . clobetasol (TEMOVATE) 0.05 % external solution APPLY 1 APPLICATION TOPICALLY 2 TIMES DAILY 50 mL 1  . cloNIDine (CATAPRES) 0.1 MG tablet Take 1 tablet (0.1 mg total) by mouth at bedtime. 90 tablet 0  . losartan (COZAAR) 50 MG tablet Take 1 tablet (50 mg total) by mouth daily. 30 tablet 3  . methocarbamol (ROBAXIN-750) 750 MG tablet Take 1 tablet (750 mg total) by mouth 2 (two) times daily as needed for muscle spasms. 60 tablet 1  . Multiple Vitamins-Iron (MULTIVITAMINS WITH IRON) TABS Take 1 tablet by mouth daily.     No current facility-administered medications for this visit.     Allergies as of 07/05/2016 - Review Complete 07/05/2016  Allergen Reaction Noted  . Amoxicillin Rash 04/11/2016  . Latex Itching 05/18/2012  . Penicillins  04/15/2016    Family History  Problem Relation Age of Onset  . Diabetes Mother   . Hypertension Mother   . Hyperlipidemia Father   . Cancer Paternal Aunt   . Diabetes Maternal Grandmother   . Diabetes Paternal Grandmother   . Colon cancer Other  Social History   Social History  . Marital status: Single    Spouse name: N/A  . Number of children: N/A  . Years of education: N/A   Occupational History  . Not on file.   Social History Main Topics  . Smoking status: Never Smoker  . Smokeless tobacco: Never Used  . Alcohol use 0.0 oz/week     Comment: occasional  . Drug use: No  . Sexual activity: Yes    Partners: Male    Birth control/ protection: Surgical     Comment: inconsistent condom use due Latex sensitivity   Other Topics Concern  . Not on file   Social History Narrative  . No narrative on file    Review of Systems:    Constitutional: No weight loss, fever or  chills Skin: No rash  Cardiovascular: No chest pain Respiratory: No SOB  Gastrointestinal: See HPI and otherwise negative Genitourinary: No dysuria  Neurological: No headache, dizziness or syncope Musculoskeletal: Chronic back pain Hematologic: No bleeding  Psychiatric: Positive history of anxiety   Physical Exam:  Vital signs: BP 118/76   Pulse 70   Ht 5\' 6"  (1.676 m)   Wt 219 lb 9.6 oz (99.6 kg)   LMP 11/12/2012   SpO2 99%   BMI 35.44 kg/m   Constitutional:   Pleasant African-American female appears to be in NAD, Well developed, Well nourished, alert and cooperative Head:  Normocephalic and atraumatic. Eyes:   PEERL, EOMI. No icterus. Conjunctiva pink. Ears:  Normal auditory acuity. Neck:  Supple Throat: Oral cavity and pharynx without inflammation, swelling or lesion.  Respiratory: Respirations even and unlabored. Lungs clear to auscultation bilaterally.   No wheezes, crackles, or rhonchi.  Cardiovascular: Normal S1, S2. No MRG. Regular rate and rhythm. No peripheral edema, cyanosis or pallor.  Gastrointestinal:  Soft, nondistended, nontender. No rebound or guarding. Normal bowel sounds. No appreciable masses or hepatomegaly. Rectal:  Not performed.  Msk:  Symmetrical without gross deformities. Without edema, no deformity or joint abnormality.  Neurologic:  Alert and  oriented x4;  grossly normal neurologically.  Skin:   Dry and intact without significant lesions or rashes. Psychiatric:  Demonstrates good judgement and reason without abnormal affect or behaviors.  No recent labs.  ABDOMEN - 2 VIEW 03/18/16  COMPARISON:  None.  FINDINGS: The bowel gas pattern is normal. Moderate stool in the right colon. There is no evidence of free air. No radio-opaque calculi. The lung bases are clear. No acute osseous abnormality.  IMPRESSION: Normal bowel gas pattern, moderate stool in the right colon.  Electronically Signed   By: Jeb Levering M.D.   On: 03/18/2016  19:28  Assessment: 1. Constipation: Chronic for the patient ever since she was very little, currently if she remembers to use her MiraLAX on a daily basis she can maintain somewhat regular bowel habits, but often she forgets to do this, not interested in starting another medication for constipation, no immediate family history of colon cancer, no alarm signs or red flags; likely represents slow transit constipation/IBS C  Plan: 1. Discussed the basic recommendations for constipation with the patient including high-fiber diet, 25-35 g per day with use of fiber supplement and/or through her diet. Did provide her with a high-fiber handout. 2. Recommend the patient increase her daily water intake to at least 6-8 8 ounce glasses of water per day 3. Recommend the patient partake in regular daily exercise at least 30 minutes per day 4. Discussed with the patient that she  could trial a probiotic. Did suggest Align for the next 2 months at least 5. Patient was prescribed Polyethylene Glycol to be used 1-2 times daily. 6. Patient to follow in clinic as needed in the future with either Dr. Ardis Hughs, as he is the supervising physician this afternoon, or myself  Ellouise Newer, PA-C [facility] 07/05/2016, 2:54 PM  Cc: Arnoldo Morale, MD

## 2016-07-05 NOTE — Progress Notes (Signed)
I agree with the above note, plan 

## 2016-08-02 ENCOUNTER — Other Ambulatory Visit: Payer: Self-pay | Admitting: Family Medicine

## 2016-08-25 ENCOUNTER — Other Ambulatory Visit: Payer: Self-pay | Admitting: Family Medicine

## 2016-08-25 DIAGNOSIS — I10 Essential (primary) hypertension: Secondary | ICD-10-CM

## 2016-09-04 ENCOUNTER — Other Ambulatory Visit: Payer: Self-pay | Admitting: Family Medicine

## 2016-09-04 DIAGNOSIS — I10 Essential (primary) hypertension: Secondary | ICD-10-CM

## 2016-09-10 ENCOUNTER — Other Ambulatory Visit: Payer: Self-pay | Admitting: Family Medicine

## 2016-09-10 DIAGNOSIS — M25541 Pain in joints of right hand: Secondary | ICD-10-CM

## 2016-09-10 DIAGNOSIS — M25542 Pain in joints of left hand: Principal | ICD-10-CM

## 2016-09-11 ENCOUNTER — Ambulatory Visit: Payer: BLUE CROSS/BLUE SHIELD | Attending: Family Medicine | Admitting: Family Medicine

## 2016-09-11 ENCOUNTER — Encounter: Payer: Self-pay | Admitting: Family Medicine

## 2016-09-11 VITALS — BP 130/60 | HR 88 | Temp 98.0°F | Wt 224.2 lb

## 2016-09-11 DIAGNOSIS — L930 Discoid lupus erythematosus: Secondary | ICD-10-CM | POA: Diagnosis not present

## 2016-09-11 DIAGNOSIS — M329 Systemic lupus erythematosus, unspecified: Secondary | ICD-10-CM | POA: Insufficient documentation

## 2016-09-11 DIAGNOSIS — E559 Vitamin D deficiency, unspecified: Secondary | ICD-10-CM | POA: Insufficient documentation

## 2016-09-11 DIAGNOSIS — Z880 Allergy status to penicillin: Secondary | ICD-10-CM | POA: Insufficient documentation

## 2016-09-11 DIAGNOSIS — I10 Essential (primary) hypertension: Secondary | ICD-10-CM

## 2016-09-11 DIAGNOSIS — Z79899 Other long term (current) drug therapy: Secondary | ICD-10-CM | POA: Diagnosis not present

## 2016-09-11 DIAGNOSIS — Z90710 Acquired absence of both cervix and uterus: Secondary | ICD-10-CM | POA: Insufficient documentation

## 2016-09-11 DIAGNOSIS — IMO0002 Reserved for concepts with insufficient information to code with codable children: Secondary | ICD-10-CM | POA: Insufficient documentation

## 2016-09-11 MED ORDER — LOSARTAN POTASSIUM 50 MG PO TABS: 50.0000 mg | ORAL_TABLET | Freq: Every day | ORAL | 6 refills | Status: DC

## 2016-09-11 NOTE — Progress Notes (Signed)
Subjective:  Patient ID: Holly Valencia, female    DOB: 12/20/1975  Age: 41 y.o. 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 She informs me she saw her dermatologist previously and stopped when she lost medical coverage and was informed that she would need to be evaluated for SLE down the road.  She complains of arthralgias, (described as "her bones about to break") myalgias, fatigue, mild rash, rash and upper extremities. She states at this time the rash has cleared as she uses a topical steroid. Symptoms persist even if she has had a good night's rest.  Past Medical History:  Diagnosis Date  . Allergy   . Anemia   . Anxiety    no meds  . Fibroids   . Headache(784.0)   . Heart murmur    as a child  . Hypertension   . Lupus    Of the Skin only    Past Surgical History:  Procedure Laterality Date  . ABDOMINAL HYSTERECTOMY    . NO PAST SURGERIES    . SUPRACERVICAL ABDOMINAL HYSTERECTOMY N/A 11/25/2012   Procedure: HYSTERECTOMY SUPRACERVICAL ABDOMINAL;  Surgeon: Mora Bellman, MD;  Location: [facility];  Service: Gynecology;  Laterality: N/A;    Allergies  Allergen Reactions  . Amoxicillin Rash  . Latex Itching  . Penicillins      Outpatient Medications Prior to Visit  Medication Sig Dispense Refill  . cetirizine (ZYRTEC) 10 MG tablet Take 1 tablet (10 mg total) by mouth daily. 30 tablet 11  . clobetasol (TEMOVATE) 0.05 % external solution APPLY 1 APPLICATION TOPICALLY 2 TIMES DAILY 50 mL 1  . cloNIDine (CATAPRES) 0.1 MG tablet Take 1 tablet (0.1 mg total) by mouth at bedtime. 90 tablet 0  . methocarbamol (ROBAXIN-750) 750 MG tablet Take 1 tablet (750 mg total) by mouth 2 (two) times daily as needed for muscle spasms. 60 tablet 1  . Multiple Vitamins-Iron  (MULTIVITAMINS WITH IRON) TABS Take 1 tablet by mouth daily.    . polyethylene glycol powder (GLYCOLAX/MIRALAX) powder Take 17 g by mouth daily. 250 g 3  . losartan (COZAAR) 50 MG tablet TAKE 1 TABLET BY MOUTH DAILY 30 tablet 0   No facility-administered medications prior to visit.     ROS Review of Systems  Constitutional: Negative for activity change, appetite change and fatigue.  HENT: Negative for congestion, sinus pressure and sore throat.   Eyes: Negative for visual disturbance.  Respiratory: Negative for cough, chest tightness, shortness of breath and wheezing.   Cardiovascular: Negative for chest pain and palpitations.  Gastrointestinal: Negative for abdominal distention, abdominal pain and constipation.  Endocrine: Negative for polydipsia.  Genitourinary: Negative for dysuria and frequency.  Musculoskeletal:       See hpi  Skin: Positive for rash.  Neurological: Negative for tremors, light-headedness and numbness.  Hematological: Does not bruise/bleed easily.  Psychiatric/Behavioral: Negative for agitation and behavioral problems.    Objective:  BP 130/60   Pulse 88   Temp 98 F (36.7 C) (Oral)   Wt 224 lb 3.2 oz (101.7 kg)   LMP 11/12/2012   SpO2 99%   BMI 36.19 kg/m   BP/Weight 09/11/2016 5/83/0940 10/19/8086  Systolic BP 110 315 945  Diastolic BP 60 76 92  Wt. (Lbs) 224.2 219.6 212  BMI 36.19 35.44 34.22  Physical Exam  Constitutional: She is oriented to person, place, and time. She appears well-developed and well-nourished.  Cardiovascular: Normal rate, normal heart sounds and intact distal pulses.   No murmur heard. Pulmonary/Chest: Effort normal and breath sounds normal. She has no wheezes. She has no rales. She exhibits no tenderness.  Abdominal: Soft. Bowel sounds are normal. She exhibits no distension and no mass. There is no tenderness.  Musculoskeletal: Normal range of motion.  Neurological: She is alert and oriented to person, place, and time.   Skin:  No rash noticed on exam     Assessment & Plan:   1. Essential hypertension Controlled Low-sodium diet - losartan (COZAAR) 50 MG tablet; Take 1 tablet (50 mg total) by mouth daily.  Dispense: 30 tablet; Refill: 6 - CMP14+EGFR  2. Discoid lupus erythematosus We'll screen for SLE - CBC with Differential/Platelet - ANA - Sedimentation rate - Anti-DNA antibody, double-stranded - ANTIPHOSPHOLIPID SYNDROME PROF - C3 complement - C4 complement  3. Vitamin D deficiency Previously was vitamin D deficient which could explain fatigue - Vitamin D, 25-hydroxy   Meds ordered this encounter  Medications  . losartan (COZAAR) 50 MG tablet    Sig: Take 1 tablet (50 mg total) by mouth daily.    Dispense:  30 tablet    Refill:  6    Must have office visit for refills    Follow-up: Return in about 3 months (around 12/12/2016) for Follow-up on hypertension.    This note has been created with Surveyor, quantity. Any transcriptional errors are unintentional.     Arnoldo Morale MD

## 2016-09-11 NOTE — Patient Instructions (Signed)

## 2016-09-12 ENCOUNTER — Other Ambulatory Visit: Payer: Self-pay | Admitting: Family Medicine

## 2016-09-12 MED ORDER — ERGOCALCIFEROL 1.25 MG (50000 UT) PO CAPS: 50000.0000 [IU] | ORAL_CAPSULE | ORAL | 1 refills | Status: DC

## 2016-09-13 LAB — C3 COMPLEMENT: COMPLEMENT C3, SERUM: 161 mg/dL (ref 82–167)

## 2016-09-13 LAB — CBC WITH DIFFERENTIAL/PLATELET
BASOS: 0 %
Basophils Absolute: 0 10*3/uL (ref 0.0–0.2)
EOS (ABSOLUTE): 0.1 10*3/uL (ref 0.0–0.4)
Eos: 1 %
HEMATOCRIT: 40.5 % (ref 34.0–46.6)
Hemoglobin: 13.5 g/dL (ref 11.1–15.9)
IMMATURE GRANS (ABS): 0 10*3/uL (ref 0.0–0.1)
Immature Granulocytes: 0 %
Lymphocytes Absolute: 1.8 10*3/uL (ref 0.7–3.1)
Lymphs: 29 %
MCH: 27.8 pg (ref 26.6–33.0)
MCHC: 33.3 g/dL (ref 31.5–35.7)
MCV: 84 fL (ref 79–97)
MONOS ABS: 0.5 10*3/uL (ref 0.1–0.9)
Monocytes: 8 %
NEUTROS ABS: 3.8 10*3/uL (ref 1.4–7.0)
Neutrophils: 62 %
PLATELETS: 302 10*3/uL (ref 150–379)
RBC: 4.85 x10E6/uL (ref 3.77–5.28)
RDW: 14.8 % (ref 12.3–15.4)
WBC: 6.1 10*3/uL (ref 3.4–10.8)

## 2016-09-13 LAB — ANA: Anti Nuclear Antibody(ANA): NEGATIVE

## 2016-09-13 LAB — CMP14+EGFR
A/G RATIO: 1.3 (ref 1.2–2.2)
ALBUMIN: 4.2 g/dL (ref 3.5–5.5)
ALT: 13 IU/L (ref 0–32)
AST: 19 IU/L (ref 0–40)
Alkaline Phosphatase: 80 IU/L (ref 39–117)
BUN/Creatinine Ratio: 12 (ref 9–23)
BUN: 9 mg/dL (ref 6–24)
Bilirubin Total: <0.2 mg/dL (ref 0.0–1.2)
CALCIUM: 9.3 mg/dL (ref 8.7–10.2)
CO2: 22 mmol/L (ref 18–29)
Chloride: 104 mmol/L (ref 96–106)
Creatinine, Ser: 0.74 mg/dL (ref 0.57–1.00)
GFR, EST AFRICAN AMERICAN: 115 mL/min/{1.73_m2} (ref >59)
GFR, EST NON AFRICAN AMERICAN: 100 mL/min/{1.73_m2} (ref >59)
GLOBULIN, TOTAL: 3.3 g/dL (ref 1.5–4.5)
Glucose: 88 mg/dL (ref 65–99)
POTASSIUM: 4.3 mmol/L (ref 3.5–5.2)
SODIUM: 137 mmol/L (ref 134–144)
TOTAL PROTEIN: 7.5 g/dL (ref 6.0–8.5)

## 2016-09-13 LAB — ANTIPHOSPHOLIPID SYNDROME PROF
Anticardiolipin IgG: <9 GPL U/mL (ref 0–14)
Anticardiolipin IgM: <9 MPL U/mL (ref 0–12)
DRVVT: 32.8 s (ref 0.0–47.0)
PTT LA: 33.7 s (ref 0.0–51.9)

## 2016-09-13 LAB — C4 COMPLEMENT: COMPLEMENT C4, SERUM: 43 mg/dL (ref 14–44)

## 2016-09-13 LAB — VITAMIN D 25 HYDROXY (VIT D DEFICIENCY, FRACTURES): Vit D, 25-Hydroxy: 13.5 ng/mL — ABNORMAL LOW (ref 30.0–100.0)

## 2016-09-13 LAB — ANTI-DNA ANTIBODY, DOUBLE-STRANDED: DSDNA AB: 3 [IU]/mL (ref 0–9)

## 2016-09-13 LAB — SEDIMENTATION RATE: Sed Rate: 30 mm/hr (ref 0–32)

## 2016-09-16 ENCOUNTER — Telehealth: Payer: Self-pay | Admitting: Family Medicine

## 2016-09-16 NOTE — Telephone Encounter (Signed)
Patient called back, states vm was left to review results

## 2016-09-17 ENCOUNTER — Ambulatory Visit: Payer: BLUE CROSS/BLUE SHIELD | Attending: Family Medicine | Admitting: Family Medicine

## 2016-09-17 ENCOUNTER — Encounter: Payer: Self-pay | Admitting: Family Medicine

## 2016-09-17 VITALS — BP 131/83 | HR 84 | Temp 98.1°F | Resp 18 | Ht 66.0 in | Wt 218.2 lb

## 2016-09-17 DIAGNOSIS — I10 Essential (primary) hypertension: Secondary | ICD-10-CM | POA: Diagnosis present

## 2016-09-17 DIAGNOSIS — Z90710 Acquired absence of both cervix and uterus: Secondary | ICD-10-CM | POA: Diagnosis not present

## 2016-09-17 DIAGNOSIS — M329 Systemic lupus erythematosus, unspecified: Secondary | ICD-10-CM | POA: Insufficient documentation

## 2016-09-17 DIAGNOSIS — Z880 Allergy status to penicillin: Secondary | ICD-10-CM | POA: Diagnosis not present

## 2016-09-17 DIAGNOSIS — R51 Headache: Secondary | ICD-10-CM | POA: Insufficient documentation

## 2016-09-17 DIAGNOSIS — R519 Headache, unspecified: Secondary | ICD-10-CM

## 2016-09-17 DIAGNOSIS — Z79899 Other long term (current) drug therapy: Secondary | ICD-10-CM | POA: Insufficient documentation

## 2016-09-17 DIAGNOSIS — F419 Anxiety disorder, unspecified: Secondary | ICD-10-CM | POA: Diagnosis not present

## 2016-09-17 MED ORDER — BUTALBITAL-APAP-CAFF-COD 50-325-40-30 MG PO CAPS: 1.0000 | ORAL_CAPSULE | Freq: Four times a day (QID) | ORAL | 0 refills | Status: DC | PRN

## 2016-09-17 NOTE — Progress Notes (Signed)
Subjective:  Patient ID: Holly Valencia, female    DOB: Mar 10, 1976  Age: 41 y.o. 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 She has been using Sudafed PE, BC powder with minimal relief. Also takes Zyrtec and OTC Flonase and has been using a nasal brings. She denies fevers, body aches, nasal congestion or postnasal drip and denies photophobia.  Past Medical History:  Diagnosis Date  . Allergy   . Anemia   . Anxiety    no meds  . Fibroids   . Headache(784.0)   . Heart murmur    as a child  . Hypertension   . Lupus    Of the Skin only    Past Surgical History:  Procedure Laterality Date  . ABDOMINAL HYSTERECTOMY    . NO PAST SURGERIES    . SUPRACERVICAL ABDOMINAL HYSTERECTOMY N/A 11/25/2012   Procedure: HYSTERECTOMY SUPRACERVICAL ABDOMINAL;  Surgeon: Mora Bellman, MD;  Location: [facility];  Service: Gynecology;  Laterality: N/A;    Allergies  Allergen Reactions  . Amoxicillin Rash  . Latex Itching  . Penicillins      Outpatient Medications Prior to Visit  Medication Sig Dispense Refill  . cetirizine (ZYRTEC) 10 MG tablet Take 1 tablet (10 mg total) by mouth daily. 30 tablet 11  . clobetasol (TEMOVATE) 0.05 % external solution APPLY 1 APPLICATION TOPICALLY 2 TIMES DAILY 50 mL 1  . cloNIDine (CATAPRES) 0.1 MG tablet Take 1 tablet (0.1 mg total) by mouth at bedtime. 90 tablet 0  . ergocalciferol (DRISDOL) 50000 units capsule Take 1 capsule (50,000 Units total) by mouth once a week. 9 capsule 1  . losartan (COZAAR) 50 MG tablet Take 1 tablet (50 mg total) by mouth daily. 30 tablet 6  . methocarbamol (ROBAXIN-750) 750 MG tablet Take 1 tablet (750 mg total) by mouth 2 (two) times daily as needed for muscle spasms. 60 tablet  1  . Multiple Vitamins-Iron (MULTIVITAMINS WITH IRON) TABS Take 1 tablet by mouth daily.    . polyethylene glycol powder (GLYCOLAX/MIRALAX) powder Take 17 g by mouth daily. 250 g 3   No facility-administered medications prior to visit.     ROS Review of Systems  Constitutional: Negative for activity change and appetite change.  HENT: Positive for sinus pain. Negative for sinus pressure and sore throat.   Respiratory: Negative for chest tightness, shortness of breath and wheezing.   Cardiovascular: Negative for chest pain and palpitations.  Gastrointestinal: Negative for abdominal distention, abdominal pain and constipation.  Genitourinary: Negative.   Musculoskeletal: Negative.   Neurological: Positive for headaches.  Psychiatric/Behavioral: Negative for behavioral problems and dysphoric mood.    Objective:  BP 131/83 (BP Location: Left Arm, Patient Position: Sitting, Cuff Size: Large)   Pulse 84   Temp 98.1 F (36.7 C) (Oral)   Resp 18   Ht 5\' 6"  (1.676 m)   Wt 218 lb 3.2 oz (99 kg)   LMP 11/12/2012   SpO2 98%   BMI 35.22 kg/m   BP/Weight 09/17/2016 09/11/2016 4/76/5465  Systolic BP 035 465 681  Diastolic BP 83 60 76  Wt. (Lbs) 218.2 224.2 219.6  BMI 35.22 36.19 35.44      Physical Exam  Constitutional: She is oriented to person, place, and time. She appears well-developed  and well-nourished.  HENT:  Mouth/Throat: Oropharynx is clear and moist.  Cerumen obscuring both tympanic membranes  Cardiovascular: Normal rate, normal heart sounds and intact distal pulses.   No murmur heard. Pulmonary/Chest: Effort normal and breath sounds normal. She has no wheezes. She has no rales. She exhibits no tenderness.  Abdominal: Soft. Bowel sounds are normal. She exhibits no distension and no mass. There is no tenderness.  Musculoskeletal: Normal range of motion.  Neurological: She is alert and oriented to person, place, and time.  Skin: Skin is warm and dry.  Psychiatric: She has  a normal mood and affect.     Assessment & Plan:   1. Sinus headache Continue Zyrtec and Flonase Will add Fioricet to regimen - butalbital-acetaminophen-caffeine (FIORICET/CODEINE) 50-325-40-30 MG capsule; Take 1 capsule by mouth every 6 (six) hours as needed for headache.  Dispense: 40 capsule; Refill: 0   Meds ordered this encounter  Medications  . butalbital-acetaminophen-caffeine (FIORICET/CODEINE) 50-325-40-30 MG capsule    Sig: Take 1 capsule by mouth every 6 (six) hours as needed for headache.    Dispense:  40 capsule    Refill:  0    Follow-up: Return for Follow-up of chronic medical conditions, keep previously scheduled appointment.   Arnoldo Morale MD

## 2016-09-17 NOTE — Progress Notes (Signed)
Patient is here for sinus concern.  Patient states beginning Saturday she had pressure in the nasal area. Patient complains of a pressure headache beginning Sunday and being constant and present currently. Patient has used Sudafed PE and Flonase for the symptoms. Patient states minimal relief was provided. Patient used BC powder and ibuprofen to alleviate the headache and was provided minimal relief.   Patient has taken medication today. Patient has eaten today.

## 2016-09-17 NOTE — Patient Instructions (Signed)
Sinus Headache A sinus headache occurs when the paranasal sinuses become clogged or swollen. Paranasal sinuses are air pockets within the bones of the face. Sinus headaches can range from mild to severe. What are the causes? A sinus headache can result from various conditions that affect the sinuses, such as:  Colds.  Sinus infections.  Allergies.  What are the signs or symptoms? The main symptom of this condition is a headache that may feel like pain or pressure in the face, forehead, ears, or upper teeth. People who have a sinus headache often have other symptoms, such as:  Congested or runny nose.  Fever.  Inability to smell.  Weather changes can make symptoms worse. How is this diagnosed? This condition may be diagnosed based on:  A physical exam and medical history.  Imaging tests, such as a CT scan and MRI, to check for problems with the sinuses.  A specialist may look into the sinuses with a tool that has a camera (endoscopy).  How is this treated? Treatment for this condition depends on the cause.  Sinus pain that is caused by a sinus infection may be treated with antibiotic medicine.  Sinus pain that is caused by allergies may be helped by allergy medicines (antihistamines) and medicated nasal sprays.  Sinus pain that is caused by congestion may be helped by flushing the nose and sinuses with saline solution.  Follow these instructions at home:  Take medicines only as directed by your health care provider.  If you were prescribed an antibiotic medicine, finish all of it even if you start to feel better.  If you have congestion, use a nasal spray to help reduce pressure.  If directed, apply a warm, moist washcloth to your face to help relieve pain. Contact a health care provider if:  You have headaches more than one time each week.  You have sensitivity to light or sound.  You have a fever.  You feel sick to your stomach (nauseous) or you throw up  (vomit).  Your headaches do not get better with treatment. Many people think that they have a sinus headache when they actually have migraines or tension headaches. Get help right away if:  You have vision problems.  You have sudden, severe pain in your face or head.  You have a seizure.  You are confused.  You have a stiff neck. This information is not intended to replace advice given to you by your health care provider. Make sure you discuss any questions you have with your health care provider. Document Released: 05/09/2004 Document Revised: 11/26/2015 Document Reviewed: 03/28/2014 Elsevier Interactive Patient Education  2018 [employer]vier Inc.  

## 2016-09-17 NOTE — Telephone Encounter (Signed)
Pt name and DOB verified.Verified understanding to lab results.

## 2016-09-19 ENCOUNTER — Telehealth: Payer: Self-pay | Admitting: Family Medicine

## 2016-09-19 NOTE — Telephone Encounter (Signed)
PT called to request a letter from the doctor, since she having problem with the light at the computer where she work, she want someone to call her for more detail, please follow up

## 2016-09-20 NOTE — Telephone Encounter (Signed)
Patient is requesting a letter stating she is unable to work with the light reflecting off her computer which is causing headaches. Please advise on this request.

## 2016-09-23 NOTE — Telephone Encounter (Signed)
At her visit with me recently, headache was thought to be of sinus origin which should be acute in nature and should subside soon. If she does have chronic headache disorder need to be evaluated for the possibility of migraines that this can be documented in her chart and appropriate steps taken.

## 2016-10-28 ENCOUNTER — Other Ambulatory Visit: Payer: Self-pay | Admitting: Family Medicine

## 2016-12-12 ENCOUNTER — Other Ambulatory Visit: Payer: Self-pay | Admitting: Physician Assistant

## 2016-12-24 ENCOUNTER — Encounter: Payer: Self-pay | Admitting: Family Medicine

## 2016-12-24 ENCOUNTER — Ambulatory Visit: Payer: 59 | Attending: Family Medicine | Admitting: Family Medicine

## 2016-12-24 VITALS — BP 131/84 | HR 89 | Temp 98.3°F | Ht 66.0 in | Wt 220.2 lb

## 2016-12-24 DIAGNOSIS — Z880 Allergy status to penicillin: Secondary | ICD-10-CM | POA: Diagnosis not present

## 2016-12-24 DIAGNOSIS — K5909 Other constipation: Secondary | ICD-10-CM

## 2016-12-24 DIAGNOSIS — L930 Discoid lupus erythematosus: Secondary | ICD-10-CM | POA: Insufficient documentation

## 2016-12-24 DIAGNOSIS — I10 Essential (primary) hypertension: Secondary | ICD-10-CM | POA: Insufficient documentation

## 2016-12-24 DIAGNOSIS — E559 Vitamin D deficiency, unspecified: Secondary | ICD-10-CM | POA: Insufficient documentation

## 2016-12-24 DIAGNOSIS — K5900 Constipation, unspecified: Secondary | ICD-10-CM | POA: Insufficient documentation

## 2016-12-24 DIAGNOSIS — Z23 Encounter for immunization: Secondary | ICD-10-CM | POA: Insufficient documentation

## 2016-12-24 DIAGNOSIS — Z79899 Other long term (current) drug therapy: Secondary | ICD-10-CM | POA: Diagnosis not present

## 2016-12-24 MED ORDER — CLOBETASOL PROPIONATE 0.05 % EX SOLN: CUTANEOUS | 1 refills | Status: DC

## 2016-12-24 MED ORDER — CLOBETASOL PROPIONATE 0.05 % EX OINT: 1.0000 "application " | TOPICAL_OINTMENT | Freq: Two times a day (BID) | CUTANEOUS | 1 refills | Status: DC

## 2016-12-24 MED ORDER — LOSARTAN POTASSIUM 50 MG PO TABS: 50.0000 mg | ORAL_TABLET | Freq: Every day | ORAL | 6 refills | Status: DC

## 2016-12-24 NOTE — Patient Instructions (Signed)

## 2016-12-24 NOTE — Progress Notes (Addendum)
Subjective:  Patient ID: Holly Valencia, female    DOB: [birth year]  Age: 41 y.o. MRN: [id number]  CC: Hypertension   HPI Holly Valencia is a 41 year old female with hypertension, discoid lupus erythematosus , vitamin D deficiency who presents today for a follow-up visit. Her discoid rash is controlled on clobetasol solution which she applies to her scalp. But uses the cream for her hands and is requesting a prescription for the cream. Rash is exacerbated by sunlight.  She has been compliant with her antihypertensives and denies any adverse effect.  For her vitamin D deficiency she has been taking Drisdol which was prescribed after her last blood work revealed low vitamin D of 13.5.  She endorses constipation which is not improved despite increase intake of whole wheat products; she is allergic to most fruits and is unable to take them but takes MiraLAX as needed.  Past Medical History:  Diagnosis Date  . Allergy   . Anemia   . Anxiety    no meds  . Fibroids   . Headache(784.0)   . Heart murmur    as a child  . Hypertension   . Lupus    Of the Skin only    Past Surgical History:  Procedure Laterality Date  . ABDOMINAL HYSTERECTOMY    . NO PAST SURGERIES    . SUPRACERVICAL ABDOMINAL HYSTERECTOMY N/A 11/25/2012   Procedure: HYSTERECTOMY SUPRACERVICAL ABDOMINAL;  Surgeon: Mora Bellman, MD;  Location: [facility];  Service: Gynecology;  Laterality: N/A;    Allergies  Allergen Reactions  . Amoxicillin Rash  . Latex Itching  . Penicillins      Outpatient Medications Prior to Visit  Medication Sig Dispense Refill  . butalbital-acetaminophen-caffeine (FIORICET/CODEINE) 50-325-40-30 MG capsule Take 1 capsule by mouth every 6 (six) hours as needed for headache. 40 capsule 0  . cetirizine (ZYRTEC) 10 MG tablet Take 1 tablet (10 mg total) by mouth daily. 30 tablet 11  . cloNIDine (CATAPRES) 0.1 MG tablet Take 1 tablet (0.1 mg total) by mouth at bedtime. 90 tablet 0    . ergocalciferol (DRISDOL) 50000 units capsule Take 1 capsule (50,000 Units total) by mouth once a week. 9 capsule 1  . Multiple Vitamins-Iron (MULTIVITAMINS WITH IRON) TABS Take 1 tablet by mouth daily.    . polyethylene glycol powder (GLYCOLAX/MIRALAX) powder TAKE 17 G BY MOUTH DAILY. 255 g 3  . clobetasol (TEMOVATE) 0.05 % external solution APPLY 1 APPLICATION TOPICALLY 2 TIMES DAILY 50 mL 1  . losartan (COZAAR) 50 MG tablet Take 1 tablet (50 mg total) by mouth daily. 30 tablet 6  . methocarbamol (ROBAXIN-750) 750 MG tablet Take 1 tablet (750 mg total) by mouth 2 (two) times daily as needed for muscle spasms. 60 tablet 1   No facility-administered medications prior to visit.     ROS Review of Systems  Constitutional: Negative for activity change, appetite change and fatigue.  HENT: Negative for congestion, sinus pressure and sore throat.   Eyes: Negative for visual disturbance.  Respiratory: Negative for cough, chest tightness, shortness of breath and wheezing.   Cardiovascular: Negative for chest pain and palpitations.  Gastrointestinal: Positive for constipation. Negative for abdominal distention and abdominal pain.  Endocrine: Negative for polydipsia.  Genitourinary: Negative for dysuria and frequency.  Musculoskeletal: Negative for arthralgias and back pain.  Skin: Positive for rash.  Neurological: Negative for tremors, light-headedness and numbness.  Hematological: Does not bruise/bleed easily.  Psychiatric/Behavioral: Negative for agitation and behavioral problems.    Objective:  BP 131/84   Pulse 89   Temp 98.3 F (36.8 C) (Oral)   Ht 5\' 6"  (1.676 m)   Wt 220 lb 3.2 oz (99.9 kg)   LMP 11/12/2012   SpO2 98%   BMI 35.54 kg/m   BP/Weight 12/24/2016 09/17/2016 9/74/1638  Systolic BP 453 646 803  Diastolic BP 84 83 60  Wt. (Lbs) 220.2 218.2 224.2  BMI 35.54 35.22 36.19      Physical Exam  Constitutional: She is oriented to person, place, and time. She appears  well-developed and well-nourished.  Cardiovascular: Normal rate, normal heart sounds and intact distal pulses.   No murmur heard. Pulmonary/Chest: Effort normal and breath sounds normal. She has no wheezes. She has no rales. She exhibits no tenderness.  Abdominal: Soft. Bowel sounds are normal. She exhibits no distension and no mass. There is no tenderness.  Musculoskeletal: Normal range of motion.  Neurological: She is alert and oriented to person, place, and time.  Skin:  Scaly rash on the tip of both ears     Assessment & Plan:   1. Essential hypertension Controlled Low-sodium, DASH diet, lifestyle modifications - losartan (COZAAR) 50 MG tablet; Take 1 tablet (50 mg total) by mouth daily.  Dispense: 30 tablet; Refill: 6  2. Discoid lupus erythematosus Stable - clobetasol (TEMOVATE) 0.05 % external solution; APPLY 1 APPLICATION TOPICALLY 2 TIMES DAILY  Dispense: 50 mL; Refill: 1 - clobetasol ointment (TEMOVATE) 0.05 %; Apply 1 application topically 2 (two) times daily.  Dispense: 45 g; Refill: 1  3. Need for Tdap vaccination Tdap administered  4. Constipation Increase intake of fiber MiraLAX   Meds ordered this encounter  Medications  . losartan (COZAAR) 50 MG tablet    Sig: Take 1 tablet (50 mg total) by mouth daily.    Dispense:  30 tablet    Refill:  6    Must have office visit for refills  . clobetasol (TEMOVATE) 0.05 % external solution    Sig: APPLY 1 APPLICATION TOPICALLY 2 TIMES DAILY    Dispense:  50 mL    Refill:  1  . clobetasol ointment (TEMOVATE) 0.05 %    Sig: Apply 1 application topically 2 (two) times daily.    Dispense:  45 g    Refill:  1    Follow-up: Return in about 6 months (around 06/23/2017) for Follow-up of hypertension.   Arnoldo Morale MD

## 2017-01-07 ENCOUNTER — Other Ambulatory Visit: Payer: Self-pay | Admitting: Pharmacist

## 2017-01-07 DIAGNOSIS — I10 Essential (primary) hypertension: Secondary | ICD-10-CM

## 2017-01-07 MED ORDER — LOSARTAN POTASSIUM 50 MG PO TABS: 50.0000 mg | ORAL_TABLET | Freq: Every day | ORAL | 1 refills | Status: DC

## 2017-02-25 ENCOUNTER — Telehealth: Payer: Self-pay | Admitting: Family Medicine

## 2017-02-25 DIAGNOSIS — R519 Headache, unspecified: Secondary | ICD-10-CM

## 2017-02-25 DIAGNOSIS — L930 Discoid lupus erythematosus: Secondary | ICD-10-CM

## 2017-02-25 DIAGNOSIS — R51 Headache: Principal | ICD-10-CM

## 2017-02-25 NOTE — Telephone Encounter (Signed)
Pt called to request a refill for clobetasol (TEMOVATE) 0.05 % external solution butalbital-acetaminophen-caffeine (FIORICET/CODEINE) 50-325-40-30 MG capsule ketoconazole (NIZORAL) 2 % cream [[id number]]  Please sent it to [facility] on [address], please follow up

## 2017-02-26 MED ORDER — CLOBETASOL PROPIONATE 0.05 % EX SOLN: CUTANEOUS | 1 refills | Status: DC

## 2017-02-26 MED ORDER — BUTALBITAL-APAP-CAFF-COD 50-325-40-30 MG PO CAPS: 1.0000 | ORAL_CAPSULE | Freq: Four times a day (QID) | ORAL | 0 refills | Status: DC | PRN

## 2017-02-26 MED ORDER — KETOCONAZOLE 2 % EX CREA: 1.0000 "application " | TOPICAL_CREAM | Freq: Every day | CUTANEOUS | 1 refills | Status: AC

## 2017-02-26 NOTE — Telephone Encounter (Signed)
Will forward to PCP 

## 2017-02-26 NOTE — Telephone Encounter (Signed)
Refilled; Fioricet is controlled and needs to picked up.

## 2017-02-27 NOTE — Telephone Encounter (Signed)
Pt was inform of the refill and the prescription that is ready for pick up

## 2017-04-30 ENCOUNTER — Ambulatory Visit (INDEPENDENT_AMBULATORY_CARE_PROVIDER_SITE_OTHER): Payer: Managed Care, Other (non HMO) | Admitting: Physician Assistant

## 2017-04-30 ENCOUNTER — Encounter: Payer: Self-pay | Admitting: Physician Assistant

## 2017-04-30 ENCOUNTER — Other Ambulatory Visit: Payer: Self-pay

## 2017-04-30 VITALS — BP 130/84 | HR 94 | Temp 98.5°F | Resp 16 | Ht 66.0 in | Wt 220.2 lb

## 2017-04-30 DIAGNOSIS — R059 Cough, unspecified: Secondary | ICD-10-CM

## 2017-04-30 DIAGNOSIS — R05 Cough: Secondary | ICD-10-CM

## 2017-04-30 DIAGNOSIS — Z91018 Allergy to other foods: Secondary | ICD-10-CM

## 2017-04-30 DIAGNOSIS — K219 Gastro-esophageal reflux disease without esophagitis: Secondary | ICD-10-CM

## 2017-04-30 MED ORDER — BENZONATATE 200 MG PO CAPS: 200.0000 mg | ORAL_CAPSULE | Freq: Two times a day (BID) | ORAL | 0 refills | Status: DC | PRN

## 2017-04-30 MED ORDER — FAMOTIDINE 40 MG PO TABS: 40.0000 mg | ORAL_TABLET | Freq: Every day | ORAL | 0 refills | Status: DC

## 2017-04-30 NOTE — Patient Instructions (Signed)
     IF you received an x-ray today, you will receive an invoice from [facility]. Please contact [facility] at [phone] with questions or concerns regarding your invoice.   IF you received labwork today, you will receive an invoice from [facility]. Please contact [facility] at [phone] with questions or concerns regarding your invoice.   Our billing staff will not be able to assist you with questions regarding bills from these companies.  You will be contacted with the lab results as soon as they are available. The fastest way to get your results is to activate your My Chart account. Instructions are located on the last page of this paperwork. If you have not heard from us regarding the results in 2 weeks, please contact this office.     

## 2017-04-30 NOTE — Progress Notes (Signed)
04/30/2017 11:40 AM   DOB: 05-14-1975 / MRN: [id number]  SUBJECTIVE:  Holly Valencia is a 42 y.o. female presenting for a coughing fit that lasted for hours after eating some new hot sauce.  Tells me that she was worried that she was having an allergic reaction to this new food.  She has a history of allergy to penicillin.  She takes Zyrtec every day for history of seasonal allergies.  The cough has improved today and she denies shortness of breath, chest pain, new DOE, dizziness, leg swelling.  She denies a history of asthma.    This patient tells me that she has been having heartburn for about 3 years now.  Denies painful swallowing and difficulty swallowing.  Denies bloody stools.  Has not been losing weight and she tells me she is a never smoker.  She has been taking Tums with some relief.  Tells me that any Fruit juice tends to bother her symptoms more states that she has symptoms on most days of the week, and these are worsened by lying back.    She is allergic to amoxicillin; latex; and penicillins.   She  has a past medical history of Allergy, Anemia, Anxiety, Fibroids, Headache(784.0), Heart murmur, Hypertension, and Lupus.    She  reports that  has never smoked. she has never used smokeless tobacco. She reports that she drinks alcohol. She reports that she does not use drugs. She  reports that she currently engages in sexual activity and has had partners who are Female. She reports using the following method of birth control/protection: Surgical. The patient  has a past surgical history that includes No past surgeries; Supracervical abdominal hysterectomy (N/A, 11/25/2012); and Abdominal hysterectomy.  Her family history includes Cancer in her paternal aunt; Colon cancer in her other; Diabetes in her maternal grandmother, mother, and paternal grandmother; Hyperlipidemia in her father; Hypertension in her mother.  ROS  As per HPI  The problem list and medications were reviewed  and updated by myself where necessary and exist elsewhere in the encounter.   OBJECTIVE:  BP 130/84 (BP Location: Right Arm, Patient Position: Sitting, Cuff Size: Large)   Pulse 94   Temp 98.5 F (36.9 C) (Oral)   Resp 16   Ht 5\' 6"  (1.676 m)   Wt 220 lb 3.2 oz (99.9 kg)   LMP 11/12/2012   SpO2 98%   BMI 35.54 kg/m   Physical Exam  Constitutional: She is oriented to person, place, and time. She is active.  Non-toxic appearance.  Eyes: EOM are normal. Pupils are equal, round, and reactive to light.  Cardiovascular: Normal rate, regular rhythm, S1 normal, S2 normal, normal heart sounds and intact distal pulses. Exam reveals no gallop, no friction rub and no decreased pulses.  No murmur heard. Pulmonary/Chest: Effort normal. No stridor. No tachypnea. No respiratory distress. She has no wheezes. She has no rales.  Abdominal: She exhibits no distension.  Musculoskeletal: She exhibits no edema.  Neurological: She is alert and oriented to person, place, and time. She has normal strength and normal reflexes. She is not disoriented. She displays no atrophy. No cranial nerve deficit or sensory deficit. She exhibits normal muscle tone. Coordination and gait normal.  Skin: Skin is warm and dry. She is not diaphoretic. No pallor.  Psychiatric: Her behavior is normal.    No results found for this or any previous visit (from the past 72 hour(s)).  No results found.  ASSESSMENT AND PLAN:  [name]  was seen today for cough.  Diagnoses and all orders for this visit:  Gastroesophageal reflux disease, esophagitis presence not specified: No red flags.  We will see her back in about a month to check her progress. I did advise that she elevate the head of the bed -     H. pylori breath test -     TSH -     Basic Metabolic Panel -     CBC  Allergy to food: She would benefit from screening. -     Allergens(96) Foods -     famotidine (PEPCID) 40 MG tablet; Take 1 tablet (40 mg total) by mouth  daily. Take 1 tab at night.  Cough:  most likely secondary to problem 2. -     benzonatate (TESSALON) 200 MG capsule; Take 1 capsule (200 mg total) by mouth 2 (two) times daily as needed for cough. -     famotidine (PEPCID) 40 MG tablet; Take 1 tablet (40 mg total) by mouth daily. Take 1 tab at night.    The patient is advised to call or return to clinic if she does not see an improvement in symptoms, or to seek the care of the closest emergency department if she worsens with the above plan.   Philis Fendt, MHS, PA-C [facility] 04/30/2017 11:40 AM

## 2017-04-30 NOTE — Progress Notes (Signed)
    04/30/2017 11:24 AM   DOB: 12/19/1975 / MRN: [id number]  SUBJECTIVE:  Holly Valencia is a 42 y.o. female presenting for   She is allergic to amoxicillin; latex; and penicillins.   She  has a past medical history of Allergy, Anemia, Anxiety, Fibroids, Headache(784.0), Heart murmur, Hypertension, and Lupus.    She  reports that  has never smoked. she has never used smokeless tobacco. She reports that she drinks alcohol. She reports that she does not use drugs. She  reports that she currently engages in sexual activity and has had partners who are Female. She reports using the following method of birth control/protection: Surgical. The patient  has a past surgical history that includes No past surgeries; Supracervical abdominal hysterectomy (N/A, 11/25/2012); and Abdominal hysterectomy.  Her family history includes Cancer in her paternal aunt; Colon cancer in her other; Diabetes in her maternal grandmother, mother, and paternal grandmother; Hyperlipidemia in her father; Hypertension in her mother.  ROS  The problem list and medications were reviewed and updated by myself where necessary and exist elsewhere in the encounter.   OBJECTIVE:  BP 130/84 (BP Location: Right Arm, Patient Position: Sitting, Cuff Size: Large)   Pulse 94   Temp 98.5 F (36.9 C) (Oral)   Resp 16   Ht 5\' 6"  (1.676 m)   Wt 220 lb 3.2 oz (99.9 kg)   LMP 11/12/2012   SpO2 98%   BMI 35.54 kg/m   Wt Readings from Last 3 Encounters:  04/30/17 220 lb 3.2 oz (99.9 kg)  12/24/16 220 lb 3.2 oz (99.9 kg)  09/17/16 218 lb 3.2 oz (99 kg)   Temp Readings from Last 3 Encounters:  04/30/17 98.5 F (36.9 C) (Oral)  12/24/16 98.3 F (36.8 C) (Oral)  09/17/16 98.1 F (36.7 C) (Oral)   BP Readings from Last 3 Encounters:  04/30/17 130/84  12/24/16 131/84  09/17/16 131/83   Pulse Readings from Last 3 Encounters:  04/30/17 94  12/24/16 89  09/17/16 84     Physical Exam  No results found for this or any  previous visit (from the past 72 hour(s)).  No results found.  ASSESSMENT AND PLAN:  There are no diagnoses linked to this encounter.  The patient is advised to call or return to clinic if she does not see an improvement in symptoms, or to seek the care of the closest emergency department if she worsens with the above plan.   Philis Fendt, MHS, PA-C [facility] 04/30/2017 11:24 AM

## 2017-05-01 LAB — H. PYLORI BREATH TEST: H PYLORI BREATH TEST: POSITIVE — AB

## 2017-05-02 ENCOUNTER — Telehealth: Payer: Self-pay | Admitting: Physician Assistant

## 2017-05-02 MED ORDER — BISMUTH 262 MG PO CHEW: 2.0000 | CHEWABLE_TABLET | Freq: Four times a day (QID) | ORAL | 0 refills | Status: DC

## 2017-05-02 MED ORDER — TETRACYCLINE HCL 500 MG PO CAPS: 500.0000 mg | ORAL_CAPSULE | Freq: Four times a day (QID) | ORAL | 0 refills | Status: DC

## 2017-05-02 MED ORDER — PANTOPRAZOLE SODIUM 40 MG PO TBEC: 40.0000 mg | DELAYED_RELEASE_TABLET | Freq: Every day | ORAL | 0 refills | Status: DC

## 2017-05-02 MED ORDER — METRONIDAZOLE 500 MG PO TABS: 500.0000 mg | ORAL_TABLET | Freq: Four times a day (QID) | ORAL | 0 refills | Status: DC

## 2017-05-02 NOTE — Telephone Encounter (Signed)
patient positive for H.  Pylori.  She is symptomatic.  Patient is allergic to penicillin.  I am starting quadruple therapy.  She agrees please call her advised to pick up the medicine today and take the medication exactly as prescribed.  It is okay for her to stop the H2 blocker that was most recently started in the office.   Lab Results  Component Value Date   ALT 13 09/11/2016   AST 19 09/11/2016   ALKPHOS 80 09/11/2016   BILITOT <0.2 09/11/2016   Lab Results  Component Value Date   CREATININE 0.71 04/30/2017   BUN 7 04/30/2017   NA 138 04/30/2017   K 4.2 04/30/2017   CL 102 04/30/2017   CO2 20 04/30/2017

## 2017-05-02 NOTE — Telephone Encounter (Signed)
Call placed to patient as requested, no answer on patient phone. Left message for patient with all information as well as callback number if needed/ S.Neythan Kozlov,CMA

## 2017-05-05 ENCOUNTER — Telehealth: Payer: Self-pay | Admitting: Family Medicine

## 2017-05-05 LAB — ALLERGENS(96) FOODS
Allergen Apple, IgE: 2.46 kU/L — AB
Allergen Banana IgE: <0.1 kU/L
Allergen Barley IgE: 0.16 kU/L — AB
Allergen Cabbage IgE: <0.1 kU/L
Allergen Cauliflower IgE: <0.1 kU/L
Allergen Celery IgE: 0.25 kU/L — AB
Allergen Corn, IgE: <0.1 kU/L
Allergen Cucumber IgE: <0.1 kU/L
Allergen Ginger IgE: 0.1 kU/L — AB
Allergen Gluten IgE: <0.1 kU/L
Allergen Grapefruit IgE: <0.1 kU/L
Allergen Green Bean IgE: <0.1 kU/L
Allergen Green Pea IgE: <0.1 kU/L
Allergen Lime IgE: <0.1 kU/L
Allergen Oat IgE: 0.11 kU/L — AB
Allergen Onion IgE: <0.1 kU/L
Allergen Pear IgE: 1.89 kU/L — AB
Allergen Potato, White IgE: <0.1 kU/L
Allergen Rice IgE: <0.1 kU/L
Allergen Salmon IgE: <0.1 kU/L
Allergen Strawberry IgE: 0.6 kU/L — AB
Allergen Tomato, IgE: <0.1 kU/L
Allergen Watermelon IgE: <0.1 kU/L
Allergen, Peach f95: 2.33 kU/L — AB
Basil: <0.1 kU/L
Beef IgE: <0.1 kU/L
Clam IgE: <0.1 kU/L
Codfish IgE: <0.1 kU/L
Coffee: <0.1 kU/L
Egg White IgE: <0.1 kU/L
F020-IgE Almond: 0.83 kU/L — AB
F023-IgE Crab: <0.1 kU/L
F045-IgE Yeast: <0.1 kU/L
F076-IgE Alpha Lactalbumin: <0.1 kU/L
F078-IgE Casein: <0.1 kU/L
F081-IgE Cheese, Cheddar Type: <0.1 kU/L
F096-IgE Avocado: 0.46 kU/L — AB
F202-IgE Cashew Nut: <0.1 kU/L
F222-IgE Tea: <0.1 kU/L
F242-IgE Bing Cherry: 1.67 kU/L — AB
F247-IgE Honey: <0.1 kU/L
F261-IgE Asparagus: <0.1 kU/L
F262-IgE Eggplant: <0.1 kU/L
F265-IgE Cumin: <0.1 kU/L
F278-IgE Bayleaf (Laurel): <0.1 kU/L
Kidney Bean IgE: <0.1 kU/L
Lemon: 0.15 kU/L — AB
Lima Bean IgE: <0.1 kU/L
Malt: <0.1 kU/L
Mushroom IgE: <0.1 kU/L
Orange: <0.1 kU/L
Pineapple IgE: <0.1 kU/L
Pork IgE: <0.1 kU/L
Red Beet: <0.1 kU/L
Rye IgE: 0.21 kU/L — AB
Soybean IgE: <0.1 kU/L
Vanilla: <0.1 kU/L
WHEAT IGE: 0.12 kU/L — AB
Walnut IgE: <0.1 kU/L
White Bean IgE: <0.1 kU/L

## 2017-05-05 LAB — BASIC METABOLIC PANEL
BUN/Creatinine Ratio: 10 (ref 9–23)
BUN: 7 mg/dL (ref 6–24)
CO2: 20 mmol/L (ref 20–29)
Calcium: 9.3 mg/dL (ref 8.7–10.2)
Chloride: 102 mmol/L (ref 96–106)
Creatinine, Ser: 0.71 mg/dL (ref 0.57–1.00)
GFR calc Af Amer: 121 mL/min/{1.73_m2} (ref >59)
GFR, EST NON AFRICAN AMERICAN: 105 mL/min/{1.73_m2} (ref >59)
GLUCOSE: 93 mg/dL (ref 65–99)
POTASSIUM: 4.2 mmol/L (ref 3.5–5.2)
SODIUM: 138 mmol/L (ref 134–144)

## 2017-05-05 LAB — CBC
Hematocrit: 41.6 % (ref 34.0–46.6)
Hemoglobin: 13.9 g/dL (ref 11.1–15.9)
MCH: 28.6 pg (ref 26.6–33.0)
MCHC: 33.4 g/dL (ref 31.5–35.7)
MCV: 86 fL (ref 79–97)
PLATELETS: 300 10*3/uL (ref 150–379)
RBC: 4.86 x10E6/uL (ref 3.77–5.28)
RDW: 14.8 % (ref 12.3–15.4)
WBC: 6.6 10*3/uL (ref 3.4–10.8)

## 2017-05-05 LAB — TSH: TSH: 1.26 u[IU]/mL (ref 0.450–4.500)

## 2017-05-05 NOTE — Telephone Encounter (Signed)
Pt called in to discuss her lab results.    ALSO, pt says that she was advised that her Rx's went to her pharmacy, confirmed pharmacy on file, correct. Pt says that pharmacy stated that Rx were not received.    CB: [phone] - please assist further.

## 2017-05-06 ENCOUNTER — Telehealth: Payer: Self-pay | Admitting: Physician Assistant

## 2017-05-06 NOTE — Telephone Encounter (Signed)
Copied from [place]. Topic: Quick Communication - Rx Refill/Question >> May 06, 2017 12:46 PM Cleaster Corin, NT wrote: CRM for notification. See Telephone encounter for:   05/06/17. Pt. Needs meds.  Flagyl 500mg ,  Protonix 40mg , Bismuth 262 mg chew,  Achromycin-sumycin 500mg  cap.  Pt. Calling all meds. Were sent to wrong pharmacy pt. Would like to [facility] up [facility] [id number] [place], [place] - [place] [address] Phone: [phone] Fax: [phone]

## 2017-05-06 NOTE — Telephone Encounter (Signed)
[facility] but it would be a few hours before it could be done.

## 2017-05-07 NOTE — Telephone Encounter (Signed)
LVM advising pt that lab note was sent via MyChart today. Advised to contact us regarding med issue that she is having.

## 2017-05-14 ENCOUNTER — Telehealth: Payer: Self-pay | Admitting: Family Medicine

## 2017-05-14 MED ORDER — FLUCONAZOLE 150 MG PO TABS: 150.0000 mg | ORAL_TABLET | Freq: Once | ORAL | 0 refills | Status: AC

## 2017-05-14 NOTE — Telephone Encounter (Signed)
Message sent to Philis Fendt PA

## 2017-05-14 NOTE — Telephone Encounter (Signed)
Copied from [place] [id number]. Topic: Quick Communication - Rx Refill/Question >> May 14, 2017 12:35 PM Scherrie Gerlach wrote: Medication: diflucan Pt states all the med she is taking for the h pylori has given her a yeast infection. Pt has tried OTC, and nothing is working. Request if dr can prescribe this for her.  [id number] [place], [place] [phone] (Phone) [phone] (Fax)  Pt saw Philis Fendt

## 2017-05-15 ENCOUNTER — Other Ambulatory Visit: Payer: Self-pay

## 2017-05-15 MED ORDER — FLUCONAZOLE 150 MG PO TABS: 150.0000 mg | ORAL_TABLET | Freq: Once | ORAL | 0 refills | Status: AC

## 2017-05-15 NOTE — Telephone Encounter (Signed)
LMOVM - med called to [facility]

## 2017-05-27 ENCOUNTER — Other Ambulatory Visit: Payer: Self-pay | Admitting: Physician Assistant

## 2017-05-27 DIAGNOSIS — R05 Cough: Secondary | ICD-10-CM

## 2017-05-27 DIAGNOSIS — R059 Cough, unspecified: Secondary | ICD-10-CM

## 2017-05-27 DIAGNOSIS — Z91018 Allergy to other foods: Secondary | ICD-10-CM

## 2017-05-28 ENCOUNTER — Encounter: Payer: Self-pay | Admitting: Physician Assistant

## 2017-05-28 ENCOUNTER — Other Ambulatory Visit: Payer: Self-pay

## 2017-05-28 ENCOUNTER — Ambulatory Visit: Payer: Managed Care, Other (non HMO) | Admitting: Physician Assistant

## 2017-05-28 VITALS — BP 140/96 | HR 93 | Temp 98.3°F | Resp 16 | Ht 66.0 in | Wt 219.4 lb

## 2017-05-28 DIAGNOSIS — J069 Acute upper respiratory infection, unspecified: Secondary | ICD-10-CM | POA: Diagnosis not present

## 2017-05-28 MED ORDER — AZITHROMYCIN 250 MG PO TABS: ORAL_TABLET | ORAL | 0 refills | Status: AC

## 2017-05-28 NOTE — Patient Instructions (Signed)
Try to hold off on filling the antibiotic.  I would say give it 3 more days and continue doing exactly what you are doing at home.  I would not do more than 1 or 2 BC powders a day.

## 2017-05-28 NOTE — Progress Notes (Signed)
    05/28/2017 8:27 AM   DOB: 1975/10/04 / MRN: [id number]  SUBJECTIVE:  Holly Valencia is a 42 y.o. female presenting for cough, sinus pressure and sinus pain.  She associates frontal sinus headache as well.  Denies teeth pain.  Symptoms started about 7 or so days ago.  Several sick contacts at work.  She is allergic to amoxicillin; latex; and penicillins.   She  has a past medical history of Allergy, Anemia, Anxiety, Fibroids, Headache(784.0), Heart murmur, Hypertension, and Lupus.    She  reports that  has never smoked. she has never used smokeless tobacco. She reports that she drinks alcohol. She reports that she does not use drugs. She  reports that she currently engages in sexual activity and has had partners who are Female. She reports using the following method of birth control/protection: Surgical. The patient  has a past surgical history that includes No past surgeries; Supracervical abdominal hysterectomy (N/A, 11/25/2012); and Abdominal hysterectomy.  Her family history includes Cancer in her paternal aunt; Colon cancer in her other; Diabetes in her maternal grandmother, mother, and paternal grandmother; Hyperlipidemia in her father; Hypertension in her mother.  Review of Systems  Constitutional: Positive for malaise/fatigue. Negative for chills, diaphoresis and fever.  Gastrointestinal: Negative for nausea.  Skin: Negative for rash.  Neurological: Negative for dizziness.    The problem list and medications were reviewed and updated by myself where necessary and exist elsewhere in the encounter.   OBJECTIVE:  BP (!) 140/96 (BP Location: Right Arm, Patient Position: Sitting, Cuff Size: Large)   Pulse 93   Temp 98.3 F (36.8 C) (Oral)   Resp 16   Ht 5\' 6"  (1.676 m)   Wt 219 lb 6.4 oz (99.5 kg)   LMP 11/12/2012   SpO2 99%   BMI 35.41 kg/m   Physical Exam  Constitutional: She is active.  Non-toxic appearance.  Cardiovascular: Normal rate, regular rhythm, S1 normal, S2  normal, normal heart sounds and intact distal pulses. Exam reveals no gallop, no friction rub and no decreased pulses.  No murmur heard. Pulmonary/Chest: Effort normal. No stridor. No tachypnea. No respiratory distress. She has no wheezes. She has no rales.  Abdominal: She exhibits no distension.  Musculoskeletal: She exhibits no edema.  Neurological: She is alert.  Skin: Skin is warm and dry. She is not diaphoretic. No pallor.    No results found for this or any previous visit (from the past 72 hour(s)).  No results found.  ASSESSMENT AND PLAN:  Holly Valencia was seen today for sinus problem.  Diagnoses and all orders for this visit:  Acute URI Comments: Exam reassuring.  Most likely viral.  7 days total of symptoms.  Z-Pak if symptoms persist past 10 days.  Other orders -     azithromycin (ZITHROMAX) 250 MG tablet; Take 2 tabs PO x 1 dose, then 1 tab PO QD x 4 days.  Fill only if not improving after 10 days of symptoms or any time if worsening.    The patient is advised to call or return to clinic if she does not see an improvement in symptoms, or to seek the care of the closest emergency department if she worsens with the above plan.   Philis Fendt, MHS, PA-C [facility] 05/28/2017 8:27 AM

## 2017-06-30 ENCOUNTER — Other Ambulatory Visit: Payer: Self-pay | Admitting: Family Medicine

## 2017-06-30 ENCOUNTER — Other Ambulatory Visit: Payer: Self-pay | Admitting: Physician Assistant

## 2017-06-30 DIAGNOSIS — R059 Cough, unspecified: Secondary | ICD-10-CM

## 2017-06-30 DIAGNOSIS — R05 Cough: Secondary | ICD-10-CM

## 2017-06-30 DIAGNOSIS — L930 Discoid lupus erythematosus: Secondary | ICD-10-CM

## 2017-06-30 DIAGNOSIS — Z91018 Allergy to other foods: Secondary | ICD-10-CM

## 2017-09-03 ENCOUNTER — Ambulatory Visit: Payer: Managed Care, Other (non HMO) | Attending: Family Medicine | Admitting: Family Medicine

## 2017-09-03 ENCOUNTER — Encounter: Payer: Self-pay | Admitting: Family Medicine

## 2017-09-03 VITALS — BP 158/97 | HR 100 | Temp 98.0°F | Ht 66.0 in | Wt 213.6 lb

## 2017-09-03 DIAGNOSIS — Z79899 Other long term (current) drug therapy: Secondary | ICD-10-CM | POA: Insufficient documentation

## 2017-09-03 DIAGNOSIS — I10 Essential (primary) hypertension: Secondary | ICD-10-CM | POA: Diagnosis not present

## 2017-09-03 DIAGNOSIS — IMO0002 Reserved for concepts with insufficient information to code with codable children: Secondary | ICD-10-CM

## 2017-09-03 DIAGNOSIS — F419 Anxiety disorder, unspecified: Secondary | ICD-10-CM | POA: Diagnosis not present

## 2017-09-03 DIAGNOSIS — J302 Other seasonal allergic rhinitis: Secondary | ICD-10-CM | POA: Diagnosis not present

## 2017-09-03 DIAGNOSIS — L930 Discoid lupus erythematosus: Secondary | ICD-10-CM | POA: Diagnosis not present

## 2017-09-03 DIAGNOSIS — E559 Vitamin D deficiency, unspecified: Secondary | ICD-10-CM

## 2017-09-03 DIAGNOSIS — J329 Chronic sinusitis, unspecified: Secondary | ICD-10-CM | POA: Insufficient documentation

## 2017-09-03 DIAGNOSIS — J328 Other chronic sinusitis: Secondary | ICD-10-CM | POA: Diagnosis not present

## 2017-09-03 DIAGNOSIS — M329 Systemic lupus erythematosus, unspecified: Secondary | ICD-10-CM | POA: Diagnosis not present

## 2017-09-03 MED ORDER — LOSARTAN POTASSIUM 50 MG PO TABS: 50.0000 mg | ORAL_TABLET | Freq: Every day | ORAL | 1 refills | Status: DC

## 2017-09-03 MED ORDER — MONTELUKAST SODIUM 10 MG PO TABS: 10.0000 mg | ORAL_TABLET | Freq: Every day | ORAL | 3 refills | Status: AC

## 2017-09-03 MED ORDER — PREDNISONE 20 MG PO TABS: 20.0000 mg | ORAL_TABLET | Freq: Every day | ORAL | 0 refills | Status: DC

## 2017-09-03 NOTE — Progress Notes (Signed)
Subjective:  Patient ID: Holly Valencia, female    DOB: 1975/07/17  Age: 42 y.o. MRN: [id number]  CC: Hypertension   HPI Holly Valencia is a 42 year old female with hypertension, discoid lupus erythematosus , vitamin D deficiency who presents today for a follow-up visit. She complains her discoid lupus rash flares up when she goes under the sun and she has been avoiding going outside and has been using her clobetasol cream on her extremities and the lotion on her hair.  She is planning to see dermatology as the rash is now appearing on her face and she is having to apply the cream to her face. Her blood pressure is elevated and she endorses compliance with her antihypertensive. She complains of sinus pressure, Hg anterior eyes, of unknown throat pain for the last 1 month which has not responded to the use of Flonase or Zyrtec.  She denies fevers.  Past Medical History:  Diagnosis Date  . Allergy   . Anemia   . Anxiety    no meds  . Fibroids   . Headache(784.0)   . Heart murmur    as a child  . Hypertension   . Lupus ([place])    Of the Skin only    Past Surgical History:  Procedure Laterality Date  . ABDOMINAL HYSTERECTOMY    . NO PAST SURGERIES    . SUPRACERVICAL ABDOMINAL HYSTERECTOMY N/A 11/25/2012   Procedure: HYSTERECTOMY SUPRACERVICAL ABDOMINAL;  Surgeon: Mora Bellman, MD;  Location: [facility];  Service: Gynecology;  Laterality: N/A;    Allergies  Allergen Reactions  . Amoxicillin Rash  . Latex Itching  . Penicillins      Outpatient Medications Prior to Visit  Medication Sig Dispense Refill  . butalbital-acetaminophen-caffeine (FIORICET/CODEINE) 50-325-40-30 MG capsule Take 1 capsule every 6 (six) hours as needed by mouth for headache. 40 capsule 0  . clobetasol (TEMOVATE) 0.05 % external solution APPLY TOPICALLY TWICE A DAY 50 mL 1  . clobetasol ointment (TEMOVATE) 4.09 % Apply 1 application topically 2 (two) times daily. 45 g 1  . cloNIDine (CATAPRES)  0.1 MG tablet Take 1 tablet (0.1 mg total) by mouth at bedtime. 90 tablet 0  . famotidine (PEPCID) 40 MG tablet TAKE 1 TABLET BY MOUTH TWICE A DAY 60 tablet 0  . Multiple Vitamins-Iron (MULTIVITAMINS WITH IRON) TABS Take 1 tablet by mouth daily.    Marland Kitchen losartan (COZAAR) 50 MG tablet Take 1 tablet (50 mg total) by mouth daily. 90 tablet 1  . benzonatate (TESSALON) 200 MG capsule Take 1 capsule (200 mg total) by mouth 2 (two) times daily as needed for cough. (Patient not taking: Reported on 09/03/2017) 20 capsule 0  . Bismuth 262 MG CHEW Chew 2 tablets by mouth 4 (four) times daily. (Patient not taking: Reported on 09/03/2017) 80 each 0  . cetirizine (ZYRTEC) 10 MG tablet Take 1 tablet (10 mg total) by mouth daily. (Patient not taking: Reported on 09/03/2017) 30 tablet 11  . ketoconazole (NIZORAL) 2 % cream Apply 1 application daily topically. (Patient not taking: Reported on 09/03/2017) 30 g 1  . polyethylene glycol powder (GLYCOLAX/MIRALAX) powder TAKE 17 G BY MOUTH DAILY. (Patient not taking: Reported on 09/03/2017) 255 g 3  . ergocalciferol (DRISDOL) 50000 units capsule Take 1 capsule (50,000 Units total) by mouth once a week. (Patient not taking: Reported on 09/03/2017) 9 capsule 1  . metroNIDAZOLE (FLAGYL) 500 MG tablet Take 1 tablet (500 mg total) by mouth 4 (four) times daily. (Patient not  taking: Reported on 09/03/2017) 40 tablet 0  . pantoprazole (PROTONIX) 40 MG tablet Take 1 tablet (40 mg total) by mouth daily. (Patient not taking: Reported on 09/03/2017) 10 tablet 0  . tetracycline (ACHROMYCIN,SUMYCIN) 500 MG capsule Take 1 capsule (500 mg total) by mouth 4 (four) times daily. (Patient not taking: Reported on 09/03/2017) 40 capsule 0   No facility-administered medications prior to visit.     ROS Review of Systems  Constitutional: Negative for activity change, appetite change and fatigue.  HENT: Negative for congestion, sinus pressure and sore throat.   Eyes: Negative for visual disturbance.    Respiratory: Negative for cough, chest tightness, shortness of breath and wheezing.   Cardiovascular: Negative for chest pain and palpitations.  Gastrointestinal: Negative for abdominal distention, abdominal pain and constipation.  Endocrine: Negative for polydipsia.  Genitourinary: Negative for dysuria and frequency.  Musculoskeletal: Negative for arthralgias and back pain.  Skin: Positive for rash.  Neurological: Negative for tremors, light-headedness and numbness.  Hematological: Does not bruise/bleed easily.  Psychiatric/Behavioral: Negative for agitation and behavioral problems.    Objective:  BP (!) 158/97   Pulse 100   Temp 98 F (36.7 C) (Oral)   Ht 5\' 6"  (1.676 m)   Wt 213 lb 9.6 oz (96.9 kg)   LMP 11/12/2012   SpO2 99%   BMI 34.48 kg/m   BP/Weight 09/03/2017 05/28/2017 08/23/2583  Systolic BP 277 824 235  Diastolic BP 97 96 84  Wt. (Lbs) 213.6 219.4 220.2  BMI 34.48 35.41 35.54      Physical Exam  Constitutional: She is oriented to person, place, and time. She appears well-developed and well-nourished.  Cardiovascular: Normal rate, normal heart sounds and intact distal pulses.  No murmur heard. Pulmonary/Chest: Effort normal and breath sounds normal. She has no wheezes. She has no rales. She exhibits no tenderness.  Abdominal: Soft. Bowel sounds are normal. She exhibits no distension and no mass. There is no tenderness.  Musculoskeletal: Normal range of motion.  Neurological: She is alert and oriented to person, place, and time.  Skin: Rash (rash around ears and malar region) noted.  Psychiatric: She has a normal mood and affect.     Assessment & Plan:   1. Essential hypertension Elevated blood pressure Blood pressure was 131/84 at her last office visit and so I will make no regimen changes today Encouraged on low sodium, DASH diet, lifestyle modifications - losartan (COZAAR) 50 MG tablet; Take 1 tablet (50 mg total) by mouth daily.  Dispense: 90 tablet;  Refill: 1 - Lipid panel; Future  2. Vitamin D deficiency - VITAMIN D 25 Hydroxy (Vit-D Deficiency, Fractures); Future  3. Seasonal allergies Uncontrolled We will add short course of prednisone and also add Singulair - montelukast (SINGULAIR) 10 MG tablet; Take 1 tablet (10 mg total) by mouth at bedtime.  Dispense: 30 tablet; Refill: 3  4. Other chronic sinusitis - predniSONE (DELTASONE) 20 MG tablet; Take 1 tablet (20 mg total) by mouth daily with breakfast.  Dispense: 5 tablet; Refill: 0  5.  Discoid lupus Continue clobetasol ointment and clobetasol solution for her scalp Advised to make appointment with dermatologist  Meds ordered this encounter  Medications  . losartan (COZAAR) 50 MG tablet    Sig: Take 1 tablet (50 mg total) by mouth daily.    Dispense:  90 tablet    Refill:  1  . predniSONE (DELTASONE) 20 MG tablet    Sig: Take 1 tablet (20 mg total) by mouth daily with  breakfast.    Dispense:  5 tablet    Refill:  0  . montelukast (SINGULAIR) 10 MG tablet    Sig: Take 1 tablet (10 mg total) by mouth at bedtime.    Dispense:  30 tablet    Refill:  3    Follow-up: Return in about 6 months (around 03/06/2018) for Follow-up of chronic medical conditions.   Charlott Rakes MD

## 2017-09-03 NOTE — Patient Instructions (Signed)

## 2017-09-04 ENCOUNTER — Telehealth: Payer: Self-pay

## 2017-09-04 ENCOUNTER — Encounter: Payer: Self-pay | Admitting: Family Medicine

## 2017-09-04 DIAGNOSIS — L930 Discoid lupus erythematosus: Secondary | ICD-10-CM

## 2017-09-04 MED ORDER — CLOBETASOL PROPIONATE 0.05 % EX SOLN: CUTANEOUS | 1 refills | Status: DC

## 2017-09-04 NOTE — Telephone Encounter (Signed)
Refilled

## 2017-09-09 ENCOUNTER — Ambulatory Visit: Payer: Managed Care, Other (non HMO) | Attending: Family Medicine

## 2017-09-09 ENCOUNTER — Other Ambulatory Visit: Payer: Self-pay

## 2017-09-09 DIAGNOSIS — L930 Discoid lupus erythematosus: Secondary | ICD-10-CM

## 2017-09-09 DIAGNOSIS — I10 Essential (primary) hypertension: Secondary | ICD-10-CM | POA: Insufficient documentation

## 2017-09-09 DIAGNOSIS — E559 Vitamin D deficiency, unspecified: Secondary | ICD-10-CM | POA: Insufficient documentation

## 2017-09-09 MED ORDER — CLOBETASOL PROPIONATE 0.05 % EX SOLN: CUTANEOUS | 1 refills | Status: DC

## 2017-09-09 NOTE — Progress Notes (Signed)
Patient here for lab visit only 

## 2017-09-09 NOTE — Progress Notes (Signed)
[facility] for Clobetasol solution. Rx re sent again.

## 2017-09-10 ENCOUNTER — Other Ambulatory Visit: Payer: Self-pay | Admitting: Family Medicine

## 2017-09-10 LAB — LIPID PANEL
CHOLESTEROL TOTAL: 145 mg/dL (ref 100–199)
Chol/HDL Ratio: 3.1 ratio (ref 0.0–4.4)
HDL: 47 mg/dL (ref >39)
LDL CALC: 82 mg/dL (ref 0–99)
Triglycerides: 79 mg/dL (ref 0–149)
VLDL CHOLESTEROL CAL: 16 mg/dL (ref 5–40)

## 2017-09-10 LAB — VITAMIN D 25 HYDROXY (VIT D DEFICIENCY, FRACTURES): VIT D 25 HYDROXY: 15.1 ng/mL — AB (ref 30.0–100.0)

## 2017-09-10 MED ORDER — ERGOCALCIFEROL 1.25 MG (50000 UT) PO CAPS: 50000.0000 [IU] | ORAL_CAPSULE | ORAL | 1 refills | Status: DC

## 2017-10-04 ENCOUNTER — Other Ambulatory Visit: Payer: Self-pay | Admitting: Family Medicine

## 2017-10-04 DIAGNOSIS — R519 Headache, unspecified: Secondary | ICD-10-CM

## 2017-10-04 DIAGNOSIS — E8941 Symptomatic postprocedural ovarian failure: Secondary | ICD-10-CM

## 2017-10-04 DIAGNOSIS — R51 Headache: Principal | ICD-10-CM

## 2017-10-05 MED ORDER — CLONIDINE HCL 0.1 MG PO TABS: 0.1000 mg | ORAL_TABLET | Freq: Every day | ORAL | 0 refills | Status: DC

## 2017-10-06 MED ORDER — BUTALBITAL-APAP-CAFF-COD 50-325-40-30 MG PO CAPS: 1.0000 | ORAL_CAPSULE | Freq: Four times a day (QID) | ORAL | 0 refills | Status: DC | PRN

## 2017-10-06 NOTE — Telephone Encounter (Signed)
Received rxn request for Fioricet/codeine.  [place] reviewed.  Will give limited rxn in Dr. Smitty Pluck absence.

## 2017-10-17 ENCOUNTER — Other Ambulatory Visit: Payer: Self-pay | Admitting: Physician Assistant

## 2017-10-17 DIAGNOSIS — R059 Cough, unspecified: Secondary | ICD-10-CM

## 2017-10-17 DIAGNOSIS — Z91018 Allergy to other foods: Secondary | ICD-10-CM

## 2017-10-17 DIAGNOSIS — R05 Cough: Secondary | ICD-10-CM

## 2017-11-02 ENCOUNTER — Encounter (HOSPITAL_COMMUNITY): Payer: Self-pay

## 2017-11-02 ENCOUNTER — Telehealth (HOSPITAL_COMMUNITY): Payer: Self-pay | Admitting: Emergency Medicine

## 2017-11-02 ENCOUNTER — Other Ambulatory Visit: Payer: Self-pay | Admitting: Family Medicine

## 2017-11-02 ENCOUNTER — Ambulatory Visit (HOSPITAL_COMMUNITY)
Admission: EM | Admit: 2017-11-02 | Discharge: 2017-11-02 | Disposition: A | Discharge status: Home / Self Care | Payer: Managed Care, Other (non HMO) | Attending: Family Medicine | Admitting: Family Medicine

## 2017-11-02 DIAGNOSIS — J0141 Acute recurrent pansinusitis: Secondary | ICD-10-CM

## 2017-11-02 DIAGNOSIS — R51 Headache: Secondary | ICD-10-CM

## 2017-11-02 DIAGNOSIS — R519 Headache, unspecified: Secondary | ICD-10-CM

## 2017-11-02 MED ORDER — KETOROLAC TROMETHAMINE 60 MG/2ML IM SOLN
60.0000 mg | Freq: Once | INTRAMUSCULAR | Status: AC
  Administered 2017-11-02: 60 mg via INTRAMUSCULAR

## 2017-11-02 MED ORDER — DOXYCYCLINE HYCLATE 100 MG PO CAPS
100.0000 mg | ORAL_CAPSULE | Freq: Two times a day (BID) | ORAL | 0 refills | Status: DC
Start: 2017-11-02 — End: 2018-02-23

## 2017-11-02 MED ORDER — FLUCONAZOLE 150 MG PO TABS: 150.0000 mg | ORAL_TABLET | Freq: Once | ORAL | 0 refills | Status: AC

## 2017-11-02 MED ORDER — METOCLOPRAMIDE HCL 5 MG/ML IJ SOLN
5.0000 mg | Freq: Once | INTRAMUSCULAR | Status: AC
  Administered 2017-11-02: 5 mg via INTRAMUSCULAR

## 2017-11-02 MED ORDER — BUTALBITAL-APAP-CAFF-COD 50-325-40-30 MG PO CAPS: 1.0000 | ORAL_CAPSULE | Freq: Four times a day (QID) | ORAL | 0 refills | Status: DC | PRN

## 2017-11-02 MED ORDER — KETOROLAC TROMETHAMINE 60 MG/2ML IM SOLN
INTRAMUSCULAR | Status: AC
  Filled 2017-11-02: qty 2

## 2017-11-02 MED ORDER — METOCLOPRAMIDE HCL 5 MG/ML IJ SOLN
INTRAMUSCULAR | Status: AC
  Filled 2017-11-02: qty 2

## 2017-11-02 MED ORDER — DEXAMETHASONE SODIUM PHOSPHATE 10 MG/ML IJ SOLN
INTRAMUSCULAR | Status: AC
  Filled 2017-11-02: qty 1

## 2017-11-02 MED ORDER — DOXYCYCLINE HYCLATE 100 MG PO CAPS: 100.0000 mg | ORAL_CAPSULE | Freq: Two times a day (BID) | ORAL | 0 refills | Status: DC

## 2017-11-02 MED ORDER — DEXAMETHASONE SODIUM PHOSPHATE 10 MG/ML IJ SOLN
10.0000 mg | Freq: Once | INTRAMUSCULAR | Status: AC
  Administered 2017-11-02: 10 mg via INTRAMUSCULAR

## 2017-11-02 NOTE — Discharge Instructions (Signed)
Toradol, Reglan, Decadron injection in office today.  Start doxycycline as directed for possible sinus infection.  I have refilled 5 pills of your Fioricet, please follow-up with your primary care for further refills needed.  Continue allergy medicine, Flonase, Nettie pot, Sudafed.  As discussed, given recurrent sinus pressure, may need further evaluation the ear nose and throat if symptoms still not improving.  Monitor for worsening symptoms, worsening headache, blurry vision, dizziness, weakness, passing out, confusion/altered mental status, go to the emergency department for further evaluation.

## 2017-11-02 NOTE — ED Triage Notes (Signed)
Pt presents with complaints of headache, nausea and sinus pain.

## 2017-11-02 NOTE — ED Provider Notes (Signed)
[facility]    CSN: [id number] Arrival date & time: 11/02/17  1557     History   Chief Complaint Chief Complaint  Patient presents with  . Facial Pain    HPI Holly Valencia is a 42 y.o. female.   42 year old female comes in for headache.  Patient states for the past month has had on and off sinus pressure with gradually worsening headache.  She continues to have nasal congestion without rhinorrhea.  Denies cough, sore throat.  Denies fever, chills, night sweats.  She has been using Flonase, Claritin, Sudafed, Nettie pot without relief.  Over the past few days, has had worsening headache.  Pain is in the frontal area, pressure and shortness, and was at first relieved by Excedrin.  States now has had worsening pain without relief on Excedrin.  She now has photophobia, phonophobia.  Nausea with 2-3 episodes of nonbilious nonbloody emesis.  She had mild dizziness without lightheadedness, syncope.  Denies weakness.  Denies vision changes.  States it is usually on Fioricet for headache, however, has not needed it, and therefore did not fill the prescription.  What had a started to worsen, she tried reaching out to the pharmacy as well as primary care and was not able to get it refilled.     Past Medical History:  Diagnosis Date  . Allergy   . Anemia   . Anxiety    no meds  . Fibroids   . Headache(784.0)   . Heart murmur    as a child  . Hypertension   . Lupus ([place])    Of the Skin only    Patient Active Problem List   Diagnosis Date Noted  . Seasonal allergies 09/03/2017  . Lupus ([place]) 09/11/2016  . Vitamin D deficiency 08/09/2015  . Anxiety 08/04/2015  . Insomnia 08/04/2015  . 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 HTN (hypertension) 10/13/2014  . Obesity 04/19/2014  . Elevated BP 04/19/2014  . DUB (dysfunctional uterine bleeding) 05/18/2012  . Fibroid uterus 03/22/2011    Past Surgical History:  Procedure Laterality Date  . ABDOMINAL  HYSTERECTOMY    . NO PAST SURGERIES    . SUPRACERVICAL ABDOMINAL HYSTERECTOMY N/A 11/25/2012   Procedure: HYSTERECTOMY SUPRACERVICAL ABDOMINAL;  Surgeon: Mora Bellman, MD;  Location: [facility];  Service: Gynecology;  Laterality: N/A;    OB History    Gravida  2   Para  1   Term  1   Preterm  0   AB  1   Living  1     SAB  0   TAB  1   Ectopic  0   Multiple  0   Live Births               Home Medications    Prior to Admission medications   Medication Sig Start Date End Date Taking? Authorizing Provider  benzonatate (TESSALON) 200 MG capsule Take 1 capsule (200 mg total) by mouth 2 (two) times daily as needed for cough. Patient not taking: Reported on 09/03/2017 04/30/17   Tereasa Coop, PA-C  Bismuth 262 MG CHEW Chew 2 tablets by mouth 4 (four) times daily. Patient not taking: Reported on 09/03/2017 05/02/17   Tereasa Coop, PA-C  butalbital-acetaminophen-caffeine (FIORICET/CODEINE) [phone] MG capsule Take 1 capsule by mouth every 6 (six) hours as needed for headache. 11/02/17   Ok Edwards, PA-C  cetirizine (ZYRTEC) 10 MG tablet Take 1 tablet (10 mg total) by mouth  daily. Patient not taking: Reported on 09/03/2017 04/11/16   Tereasa Coop, PA-C  clobetasol (TEMOVATE) 0.05 % external solution APPLY TOPICALLY TWICE A DAY 09/09/17   Charlott Rakes, MD  clobetasol ointment (TEMOVATE) 2.42 % Apply 1 application topically 2 (two) times daily. 12/24/16   Charlott Rakes, MD  cloNIDine (CATAPRES) 0.1 MG tablet Take 1 tablet (0.1 mg total) by mouth at bedtime. 10/05/17   Ladell Pier, MD  doxycycline (VIBRAMYCIN) 100 MG capsule Take 1 capsule (100 mg total) by mouth 2 (two) times daily. 11/02/17   Tasia Catchings, Wylee Dorantes V, PA-C  ergocalciferol (DRISDOL) 50000 units capsule Take 1 capsule (50,000 Units total) by mouth once a week. 09/10/17   Charlott Rakes, MD  famotidine (PEPCID) 40 MG tablet TAKE 1 TABLET BY MOUTH TWICE A DAY 10/17/17   Tereasa Coop, PA-C  fluconazole  (DIFLUCAN) 150 MG tablet Take 1 tablet (150 mg total) by mouth once for 1 dose. Then take 2nd pill 3 days after the first pill 11/02/17 11/02/17  [place], [place], PA-C  ketoconazole (NIZORAL) 2 % cream Apply 1 application daily topically. Patient not taking: Reported on 09/03/2017 02/26/17   Charlott Rakes, MD  losartan (COZAAR) 50 MG tablet Take 1 tablet (50 mg total) by mouth daily. 09/03/17   Charlott Rakes, MD  montelukast (SINGULAIR) 10 MG tablet Take 1 tablet (10 mg total) by mouth at bedtime. 09/03/17   Charlott Rakes, MD  Multiple Vitamins-Iron (MULTIVITAMINS WITH IRON) TABS Take 1 tablet by mouth daily.    [provider]  polyethylene glycol powder (GLYCOLAX/MIRALAX) powder TAKE 17 G BY MOUTH DAILY. Patient not taking: Reported on 09/03/2017 12/12/16   Levin Erp, PA  predniSONE (DELTASONE) 20 MG tablet Take 1 tablet (20 mg total) by mouth daily with breakfast. 09/03/17   Charlott Rakes, MD  losartan-hydrochlorothiazide (HYZAAR) 100-25 MG per tablet Take 1 tablet by mouth daily. 04/28/14 04/28/14  Lance Bosch, NP    Family History Family History  Problem Relation Age of Onset  . Diabetes Mother   . Hypertension Mother   . Hyperlipidemia Father   . Cancer Paternal Aunt   . Diabetes Maternal Grandmother   . Diabetes Paternal Grandmother   . Colon cancer Other     Social History Social History   Tobacco Use  . Smoking status: Never Smoker  . Smokeless tobacco: Never Used  Substance Use Topics  . Alcohol use: Yes    Alcohol/week: 0.0 oz    Comment: occasional  . Drug use: No     Allergies   Amoxicillin; Latex; and Penicillins   Review of Systems Review of Systems  Reason unable to perform ROS: See HPI as above.     Physical Exam Triage Vital Signs ED Triage Vitals [11/02/17 1632]  Enc Vitals Group     BP (!) 177/111     Pulse Rate 77     Resp 18     Temp 98.1 F (36.7 C)     Temp src      SpO2 100 %     Weight      Height      Head  Circumference      Peak Flow      Pain Score 10     Pain Loc      Pain Edu?      Excl. in Tensed?    No data found.  Updated Vital Signs BP (!) 177/111   Pulse 77   Temp 98.1 F (36.7  C)   Resp 18   LMP 11/12/2012   SpO2 100%   Physical Exam  Constitutional: She is oriented to person, place, and time. She appears well-developed and well-nourished. No distress.  HENT:  Head: Normocephalic and atraumatic.  Right Ear: Tympanic membrane, external ear and ear canal normal. Tympanic membrane is not erythematous and not bulging.  Left Ear: Tympanic membrane, external ear and ear canal normal. Tympanic membrane is not erythematous and not bulging.  Nose: Right sinus exhibits maxillary sinus tenderness and frontal sinus tenderness. Left sinus exhibits maxillary sinus tenderness and frontal sinus tenderness.  Mouth/Throat: Uvula is midline, oropharynx is clear and moist and mucous membranes are normal.  Eyes: Pupils are equal, round, and reactive to light. Conjunctivae are normal.  Neck: Normal range of motion. Neck supple.  Cardiovascular: Normal rate, regular rhythm and normal heart sounds. Exam reveals no gallop and no friction rub.  No murmur heard. Pulmonary/Chest: Effort normal and breath sounds normal. She has no decreased breath sounds. She has no wheezes. She has no rhonchi. She has no rales.  Lymphadenopathy:    She has no cervical adenopathy.  Neurological: She is alert and oriented to person, place, and time. She has normal strength. She is not disoriented. No cranial nerve deficit or sensory deficit. She displays a negative Romberg sign. Coordination and gait normal. GCS eye subscore is 4. GCS verbal subscore is 5. GCS motor subscore is 6.  Normal finger-to-nose, rapid movement.  Patient able to ambulate on and off exam table on own without difficulty.  Skin: Skin is warm and dry.  Psychiatric: She has a normal mood and affect. Her behavior is normal. Judgment normal.   UC  Treatments / Results  Labs (all labs ordered are listed, but only abnormal results are displayed) Labs Reviewed - No data to display  EKG None  Radiology No results found.  Procedures Procedures (including critical care time)  Medications Ordered in UC Medications  ketorolac (TORADOL) injection 60 mg (60 mg Intramuscular Given 11/02/17 1749)  metoCLOPramide (REGLAN) injection 5 mg (5 mg Intramuscular Given 11/02/17 1750)  dexamethasone (DECADRON) injection 10 mg (10 mg Intramuscular Given 11/02/17 1750)    Initial Impression / Assessment and Plan / UC Course  I have reviewed the triage vital signs and the nursing notes.  Pertinent labs & imaging results that were available during my care of the patient were reviewed by me and considered in my medical decision making (see chart for details).    Discussed with patient, will cover for possible sinusitis with doxycycline.  Toradol, Reglan, Decadron injection in office today.  Refilled Fioricet 5 pills.  Push fluids.  Return precautions given.  Otherwise follow-up with PCP for further evaluation and management of headaches.  Final Clinical Impressions(s) / UC Diagnoses   Final diagnoses:  Acute recurrent pansinusitis    ED Prescriptions    Medication Sig Dispense Auth. Provider   doxycycline (VIBRAMYCIN) 100 MG capsule Take 1 capsule (100 mg total) by mouth 2 (two) times daily. 20 capsule Zawadi Aplin V, PA-C   butalbital-acetaminophen-caffeine (FIORICET/CODEINE) 50-325-40-30 MG capsule Take 1 capsule by mouth every 6 (six) hours as needed for headache. 5 capsule Cathlean Sauer V, PA-C     Controlled Substance Prescriptions [place] Controlled Substance Registry consulted? Yes, I have consulted the [place] Controlled Substances Registry for this patient, and feel the risk/benefit ratio today is favorable for proceeding with this prescription for a controlled substance.   Ok Edwards, PA-C 11/02/17 1934

## 2017-11-03 NOTE — Telephone Encounter (Signed)
Pt got rx with 5 capsules from ER doc yesterday

## 2017-11-12 ENCOUNTER — Other Ambulatory Visit: Payer: Self-pay | Admitting: Physician Assistant

## 2017-11-12 DIAGNOSIS — R05 Cough: Secondary | ICD-10-CM

## 2017-11-12 DIAGNOSIS — R059 Cough, unspecified: Secondary | ICD-10-CM

## 2017-11-12 DIAGNOSIS — Z91018 Allergy to other foods: Secondary | ICD-10-CM

## 2017-11-12 MED ORDER — FAMOTIDINE 40 MG PO TABS: 40.0000 mg | ORAL_TABLET | Freq: Two times a day (BID) | ORAL | 0 refills | Status: AC

## 2018-02-23 ENCOUNTER — Encounter (HOSPITAL_COMMUNITY): Payer: Self-pay | Admitting: Emergency Medicine

## 2018-02-23 ENCOUNTER — Ambulatory Visit (HOSPITAL_COMMUNITY)
Admission: EM | Admit: 2018-02-23 | Discharge: 2018-02-23 | Disposition: A | Discharge status: Home / Self Care | Payer: Managed Care, Other (non HMO) | Attending: Family Medicine | Admitting: Family Medicine

## 2018-02-23 DIAGNOSIS — W57XXXA Bitten or stung by nonvenomous insect and other nonvenomous arthropods, initial encounter: Secondary | ICD-10-CM | POA: Diagnosis not present

## 2018-02-23 DIAGNOSIS — S1086XA Insect bite of other specified part of neck, initial encounter: Secondary | ICD-10-CM

## 2018-02-23 MED ORDER — SULFAMETHOXAZOLE-TRIMETHOPRIM 800-160 MG PO TABS: 1.0000 | ORAL_TABLET | Freq: Two times a day (BID) | ORAL | 0 refills | Status: AC

## 2018-02-23 MED ORDER — DOXYCYCLINE HYCLATE 100 MG PO CAPS: 100.0000 mg | ORAL_CAPSULE | Freq: Two times a day (BID) | ORAL | 0 refills | Status: DC

## 2018-02-23 MED ORDER — PREDNISONE 10 MG (21) PO TBPK: ORAL_TABLET | Freq: Every day | ORAL | 0 refills | Status: DC

## 2018-02-23 MED ORDER — FLUCONAZOLE 150 MG PO TABS: ORAL_TABLET | ORAL | 0 refills | Status: AC

## 2018-02-23 NOTE — ED Provider Notes (Signed)
[place]   [id number] 02/23/18 Arrival Time: 1030  ASSESSMENT & PLAN:  1. Insect bite of other part of neck, initial encounter   Question early cellulitis. No evidence of abscess. Discussed. No signs of anaphylaxis. Reassured.  Meds ordered this encounter  Medications  . predniSONE (STERAPRED UNI-PAK 21 TAB) 10 MG (21) TBPK tablet    Sig: Take by mouth daily. Take as directed.    Dispense:  21 tablet    Refill:  0  . DISCONTD: doxycycline (VIBRAMYCIN) 100 MG capsule (d/c at her request: REPORTS INTOLERANCE)    Sig: Take 1 capsule (100 mg total) by mouth 2 (two) times daily.    Dispense:  20 capsule    Refill:  0  . fluconazole (DIFLUCAN) 150 MG tablet    Sig: Take one tablet by mouth as a single dose.    Dispense:  1 tablet    Refill:  0  . sulfamethoxazole-trimethoprim (BACTRIM DS,SEPTRA DS) 800-160 MG tablet    Sig: Take 1 tablet by mouth 2 (two) times daily for 10 days.    Dispense:  20 tablet    Refill:  0   Recommend f/u in 24 hours if this has not stabilized or if this is worsening as cellulitis/infection can quickly spread. OTC analgesics if needed.  Reviewed expectations re: course of current medical issues. Questions answered. Outlined signs and symptoms indicating need for more acute intervention. Patient verbalized understanding. After Visit Summary given.   SUBJECTIVE:  Holly Valencia is a 42 y.o. female who reports possible insect bite/sting to posterior neck yesterday evening. Itching shortly after. Benadryl with mild help. This morning more painful and swollen, especially with neck movement/extension. Skin erythema noticed. No fever reported; questions feeling chilled this morning. Normal PO intake with normal swallowing. No n/v. No abdominal pain. No respiratory difficulties/SOB. No chest pains. No h/o adverse reaction to insect sting. Ambulatory without difficulty. No extremity sensation changes or weakness. No extremity edema.  ROS: As  per HPI. All other systems negative.   OBJECTIVE: Vitals:   02/23/18 1105  BP: (!) 154/99  Pulse: 97  Resp: 18  Temp: 98.9 F (37.2 C)  TempSrc: Oral  SpO2: 97%    General appearance: alert; no distress Oropharynx: normal mucosa and tongue Neck: FROM with discomfort on flexion/extension; no LAD; trachea midline Lungs: clear to auscultation bilaterally; unlabored respirations Heart: regular rate and rhythm without murmer Abd: soft; non-tender Extremities: no edema Skin: warm and dry; signs of infection: yes, approx 1x2.5 cm raised and erythematous area over her posterior neck; skin slightly thickened; no fluctuance; tenderness reported to palpation; no evidence of FB or stinger in skin Psychological: alert and cooperative; normal mood and affect  Allergies  Allergen Reactions  . Amoxicillin Rash  . Latex Itching  . Penicillins     Past Medical History:  Diagnosis Date  . Allergy   . Anemia   . Anxiety    no meds  . Fibroids   . Headache(784.0)   . Heart murmur    as a child  . Hypertension   . 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 Marital status: Single    Spouse name: Not on file  . Number of children: Not on file  . Years of education: Not on file  . Highest education level: Not on file  Occupational History  . Not on file  Social Needs  . Financial resource strain:  Not on file  . Food insecurity:    Worry: Not on file    Inability: Not on file  . Transportation needs:    Medical: Not on file    Non-medical: Not on file  Tobacco Use  . Smoking status: Never Smoker  . Smokeless tobacco: Never Used  Substance and Sexual Activity  . Alcohol use: Yes    Alcohol/week: 0.0 standard drinks    Comment: occasional  . Drug use: No  . Sexual activity: Yes    Partners: Male    Birth control/protection: Surgical    Comment: inconsistent condom use due Latex sensitivity  Lifestyle  . Physical activity:    Days per  week: Not on file    Minutes per session: Not on file  . Stress: Not on file  Relationships  . Social connections:    Talks on phone: Not on file    Gets together: Not on file    Attends religious service: Not on file    Active member of club or organization: Not on file    Attends meetings of clubs or organizations: Not on file    Relationship status: Not on file  . Intimate partner violence:    Fear of current or ex partner: Not on file    Emotionally abused: Not on file    Physically abused: Not on file    Forced sexual activity: Not on file  Other Topics Concern  . Not on file  Social History Narrative  . Not on file   Family History  Problem Relation Age of Onset  . Diabetes Mother   . Hypertension Mother   . Hyperlipidemia Father   . Cancer Paternal Aunt   . Diabetes Maternal Grandmother   . Diabetes Paternal Grandmother   . Colon cancer Other    Past Surgical History:  Procedure Laterality Date  . ABDOMINAL HYSTERECTOMY    . NO PAST SURGERIES    . SUPRACERVICAL ABDOMINAL HYSTERECTOMY N/A 11/25/2012   Procedure: HYSTERECTOMY SUPRACERVICAL ABDOMINAL;  Surgeon: Mora Bellman, MD;  Location: [facility];  Service: Gynecology;  Laterality: N/AVanessa Kick, MD 02/25/18 [phone]

## 2018-02-23 NOTE — ED Triage Notes (Signed)
Pt here for insect bite to right side of neck

## 2018-03-02 ENCOUNTER — Other Ambulatory Visit: Payer: Self-pay | Admitting: Family Medicine

## 2018-03-02 DIAGNOSIS — I10 Essential (primary) hypertension: Secondary | ICD-10-CM

## 2018-03-02 DIAGNOSIS — L930 Discoid lupus erythematosus: Secondary | ICD-10-CM

## 2018-04-08 ENCOUNTER — Emergency Department (HOSPITAL_COMMUNITY)
Admission: EM | Admit: 2018-04-08 | Discharge: 2018-04-08 | Disposition: A | Discharge status: Home / Self Care | Payer: Managed Care, Other (non HMO) | Attending: Emergency Medicine | Admitting: Emergency Medicine

## 2018-04-08 ENCOUNTER — Other Ambulatory Visit: Payer: Self-pay

## 2018-04-08 ENCOUNTER — Encounter (HOSPITAL_COMMUNITY): Payer: Self-pay | Admitting: Emergency Medicine

## 2018-04-08 DIAGNOSIS — Z79899 Other long term (current) drug therapy: Secondary | ICD-10-CM | POA: Diagnosis not present

## 2018-04-08 DIAGNOSIS — I10 Essential (primary) hypertension: Secondary | ICD-10-CM | POA: Insufficient documentation

## 2018-04-08 DIAGNOSIS — G43909 Migraine, unspecified, not intractable, without status migrainosus: Secondary | ICD-10-CM | POA: Diagnosis present

## 2018-04-08 DIAGNOSIS — G43809 Other migraine, not intractable, without status migrainosus: Secondary | ICD-10-CM

## 2018-04-08 MED ORDER — KETOROLAC TROMETHAMINE 30 MG/ML IJ SOLN
30.0000 mg | Freq: Once | INTRAMUSCULAR | Status: AC
  Administered 2018-04-08: 30 mg via INTRAVENOUS
  Filled 2018-04-08: qty 1

## 2018-04-08 MED ORDER — PROCHLORPERAZINE EDISYLATE 10 MG/2ML IJ SOLN
10.0000 mg | Freq: Once | INTRAMUSCULAR | Status: AC
  Administered 2018-04-08: 10 mg via INTRAVENOUS
  Filled 2018-04-08: qty 2

## 2018-04-08 MED ORDER — MORPHINE SULFATE (PF) 4 MG/ML IV SOLN
4.0000 mg | Freq: Once | INTRAVENOUS | Status: AC
  Administered 2018-04-08: 4 mg via INTRAVENOUS
  Filled 2018-04-08: qty 1

## 2018-04-08 MED ORDER — ONDANSETRON HCL 4 MG/2ML IJ SOLN
4.0000 mg | Freq: Once | INTRAMUSCULAR | Status: AC
  Administered 2018-04-08: 4 mg via INTRAVENOUS
  Filled 2018-04-08: qty 2

## 2018-04-08 MED ORDER — SODIUM CHLORIDE 0.9 % IV BOLUS
1000.0000 mL | Freq: Once | INTRAVENOUS | Status: AC
  Administered 2018-04-08: 1000 mL via INTRAVENOUS

## 2018-04-08 NOTE — ED Notes (Signed)
Bed: WA01 Expected date:  Expected time:  Means of arrival:  Comments: 

## 2018-04-08 NOTE — ED Triage Notes (Signed)
Migraine headache constant over last 3 days intermittant over last few weeks. Rx meds ineffective

## 2018-04-08 NOTE — ED Provider Notes (Signed)
[place] DEPT Provider Note   CSN: [id number] Arrival date & time: 04/08/18  2044     History   Chief Complaint Chief Complaint  Patient presents with  . Migraine    HPI INDIGO BARBIAN is a 42 y.o. female.  HPI Patient is a 42 year old female presents the emergency department with migraine headache over the past 3 days.  She states that her home medications have been ineffective.  She has had some mild headache over the past several weeks.  No recent injury or trauma.  She has a history of migraines and states this feels similar.  Denies nausea vomiting.  No diarrhea.  Has a neurology team.   Past Medical History:  Diagnosis Date  . Allergy   . Anemia   . Anxiety    no meds  . Fibroids   . Headache(784.0)   . Heart murmur    as a child  . Hypertension   . 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 Seasonal allergies 09/03/2017  . Lupus ([place]) 09/11/2016  . Vitamin D deficiency 08/09/2015  . Anxiety 08/04/2015  . Insomnia 08/04/2015  . 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 HTN (hypertension) 10/13/2014  . Obesity 04/19/2014  . Elevated BP 04/19/2014  . DUB (dysfunctional uterine bleeding) 05/18/2012  . Fibroid uterus 03/22/2011    Past Surgical History:  Procedure Laterality Date  . ABDOMINAL HYSTERECTOMY    . NO PAST SURGERIES    . SUPRACERVICAL ABDOMINAL HYSTERECTOMY N/A 11/25/2012   Procedure: HYSTERECTOMY SUPRACERVICAL ABDOMINAL;  Surgeon: Mora Bellman, MD;  Location: [facility];  Service: Gynecology;  Laterality: N/A;     OB History    Gravida  2   Para  1   Term  1   Preterm  0   AB  1   Living  1     SAB  0   TAB  1   Ectopic  0   Multiple  0   Live Births               Home Medications    Prior to Admission medications   Medication Sig Start Date End Date Taking? Authorizing Provider  butalbital-acetaminophen-caffeine  (FIORICET WITH CODEINE) 50-325-40-30 MG capsule TAKE 1 CAPSULE BY MOUTH EVERY 6 HOURS AS NEEDED FOR HEADACHE Patient taking differently: Take 1 capsule by mouth every 6 (six) hours as needed for headache.  11/03/17  Yes Charlott Rakes, MD  cloNIDine (CATAPRES) 0.1 MG tablet Take 1 tablet (0.1 mg total) by mouth at bedtime. 10/05/17  Yes Ladell Pier, MD  losartan (COZAAR) 50 MG tablet Take 1 tablet (50 mg total) by mouth daily. MUST MAKE APPT FOR FURTHER REFILLS 03/03/18  Yes Charlott Rakes, MD  Multiple Vitamins-Iron (MULTIVITAMINS WITH IRON) TABS Take 1 tablet by mouth daily.   Yes [provider]  benzonatate (TESSALON) 200 MG capsule Take 1 capsule (200 mg total) by mouth 2 (two) times daily as needed for cough. Patient not taking: Reported on 09/03/2017 04/30/17   Tereasa Coop, PA-C  Bismuth 262 MG CHEW Chew 2 tablets by mouth 4 (four) times daily. Patient not taking: Reported on 09/03/2017 05/02/17   Tereasa Coop, PA-C  butalbital-acetaminophen-caffeine (FIORICET/CODEINE) [phone] MG capsule Take 1 capsule by mouth every 6 (six) hours as needed for headache. Patient not taking: Reported on 04/08/2018 11/02/17   Cathlean Sauer  V, PA-C  cetirizine (ZYRTEC) 10 MG tablet Take 1 tablet (10 mg total) by mouth daily. Patient not taking: Reported on 09/03/2017 04/11/16   Tereasa Coop, PA-C  clobetasol (TEMOVATE) 0.05 % external solution APPLY TOPICALLY TWICE A DAY Patient taking differently: Apply 1 application topically 2 (two) times daily as needed (dry skin and rash).  03/03/18   Charlott Rakes, MD  clobetasol ointment (TEMOVATE) 9.02 % Apply 1 application topically 2 (two) times daily. Patient not taking: Reported on 04/08/2018 12/24/16   Charlott Rakes, MD  ergocalciferol (DRISDOL) 50000 units capsule Take 1 capsule (50,000 Units total) by mouth once a week. 09/10/17   Charlott Rakes, MD  famotidine (PEPCID) 40 MG tablet Take 1 tablet (40 mg total) by mouth 2 (two) times  daily. Patient not taking: Reported on 04/08/2018 11/12/17   Tereasa Coop, PA-C  fluconazole (DIFLUCAN) 150 MG tablet Take one tablet by mouth as a single dose. Patient not taking: Reported on 04/08/2018 02/23/18   Vanessa Kick, MD  ketoconazole (NIZORAL) 2 % cream Apply 1 application daily topically. Patient not taking: Reported on 09/03/2017 02/26/17   Charlott Rakes, MD  montelukast (SINGULAIR) 10 MG tablet Take 1 tablet (10 mg total) by mouth at bedtime. Patient not taking: Reported on 04/08/2018 09/03/17   Charlott Rakes, MD  polyethylene glycol powder (GLYCOLAX/MIRALAX) powder TAKE 17 G BY MOUTH DAILY. Patient not taking: Reported on 09/03/2017 12/12/16   Levin Erp, PA  predniSONE (STERAPRED UNI-PAK 21 TAB) 10 MG (21) TBPK tablet Take by mouth daily. Take as directed. Patient not taking: Reported on 04/08/2018 02/23/18   Vanessa Kick, MD  losartan-hydrochlorothiazide (HYZAAR) 100-25 MG per tablet Take 1 tablet by mouth daily. 04/28/14 04/28/14  Lance Bosch, NP    Family History Family History  Problem Relation Age of Onset  . Diabetes Mother   . Hypertension Mother   . Hyperlipidemia Father   . Cancer Paternal Aunt   . Diabetes Maternal Grandmother   . Diabetes Paternal Grandmother   . Colon cancer Other     Social History Social History   Tobacco Use  . Smoking status: Never Smoker  . Smokeless tobacco: Never Used  Substance Use Topics  . Alcohol use: Yes    Alcohol/week: 0.0 standard drinks    Comment: occasional  . Drug use: No     Allergies   Amoxicillin; Latex; and Penicillins   Review of Systems Review of Systems  All other systems reviewed and are negative.    Physical Exam Updated Vital Signs BP (!) 148/97 (BP Location: Right Arm)   Pulse 89   Temp 97.9 F (36.6 C) (Oral)   Resp 18   LMP 11/12/2012   SpO2 98%   Physical Exam Vitals signs and nursing note reviewed.  Constitutional:      General: She is not in acute  distress.    Appearance: She is well-developed.  HENT:     Head: Normocephalic and atraumatic.  Eyes:     Pupils: Pupils are equal, round, and reactive to light.  Neck:     Musculoskeletal: Normal range of motion.  Cardiovascular:     Rate and Rhythm: Normal rate and regular rhythm.     Heart sounds: Normal heart sounds.  Pulmonary:     Effort: Pulmonary effort is normal.     Breath sounds: Normal breath sounds.  Abdominal:     General: There is no distension.     Palpations: Abdomen is soft.  Tenderness: There is no abdominal tenderness.  Musculoskeletal: Normal range of motion.  Skin:    General: Skin is warm and dry.  Neurological:     Mental Status: She is alert and oriented to person, place, and time.     Comments: 5/5 strength in major muscle groups of  bilateral upper and lower extremities. Speech normal. No facial asymetry.   Psychiatric:        Judgment: Judgment normal.      ED Treatments / Results  Labs (all labs ordered are listed, but only abnormal results are displayed) Labs Reviewed - No data to display  EKG None  Radiology No results found.  Procedures Procedures (including critical care time)  Medications Ordered in ED Medications  sodium chloride 0.9 % bolus 1,000 mL (1,000 mLs Intravenous New Bag/Given 04/08/18 2151)  prochlorperazine (COMPAZINE) injection 10 mg (10 mg Intravenous Given 04/08/18 2152)  ketorolac (TORADOL) 30 MG/ML injection 30 mg (30 mg Intravenous Given 04/08/18 2152)  morphine 4 MG/ML injection 4 mg (4 mg Intravenous Given 04/08/18 2152)  ondansetron (ZOFRAN) injection 4 mg (4 mg Intravenous Given 04/08/18 2152)     Initial Impression / Assessment and Plan / ED Course  I have reviewed the triage vital signs and the nursing notes.  Pertinent labs & imaging results that were available during my care of the patient were reviewed by me and considered in my medical decision making (see chart for details).     10:39  PM Patient feels much better at this time.  Headache resolved with standard migraine medicines here in the emergency department.  She did develop some mild akathisia's but does not want any Benadryl or Ativan at this time.  She would prefer to go home.  No indication for advanced imaging.  No focal neurologic deficit.  Close outpatient primary care and primary neurology follow-up.  Patient and family understand return to the emergency department for new or worsening symptoms  Final Clinical Impressions(s) / ED Diagnoses   Final diagnoses:  Other migraine without status migrainosus, not intractable    ED Discharge Orders    None       Jola Schmidt, MD 04/08/18 2239

## 2018-04-08 NOTE — ED Notes (Signed)
Bed: BT66 Expected date:  Expected time:  Means of arrival:  Comments: 4s please

## 2018-04-09 ENCOUNTER — Other Ambulatory Visit: Payer: Self-pay | Admitting: Family Medicine

## 2018-04-09 DIAGNOSIS — L930 Discoid lupus erythematosus: Secondary | ICD-10-CM

## 2018-05-21 ENCOUNTER — Other Ambulatory Visit: Payer: Self-pay | Admitting: Family Medicine

## 2018-05-22 NOTE — Telephone Encounter (Signed)
Pt last seen: 09/03/17 Next appt: n/a Last RX written on: 11/03/17 Date of original fill: 11/03/17 Date of refill(s): 05/09/17  No other controlled substances were filled during this time, please refill if appropriate.

## 2018-05-25 ENCOUNTER — Other Ambulatory Visit: Payer: Self-pay | Admitting: Family Medicine

## 2018-05-25 DIAGNOSIS — L930 Discoid lupus erythematosus: Secondary | ICD-10-CM

## 2018-05-26 ENCOUNTER — Telehealth: Payer: Self-pay | Admitting: Family Medicine

## 2018-05-26 NOTE — Telephone Encounter (Signed)
Tried to contact patient no answer 

## 2018-05-26 NOTE — Telephone Encounter (Addendum)
clobetasol (TEMOVATE) 0.05 % external solution [facility] on [address]  *Next available appt is in march patient says she would like to speak to PCP because she does not feel like she should go so long without her medication. Please follow up.

## 2018-05-26 NOTE — Telephone Encounter (Signed)
She needs to schedule the appt and we will see if her PCP is ok with giving her a refill to last until the appt. She hasn't been seen since 09/03/17.

## 2018-07-08 ENCOUNTER — Other Ambulatory Visit: Payer: Self-pay | Admitting: Internal Medicine

## 2018-07-08 DIAGNOSIS — E8941 Symptomatic postprocedural ovarian failure: Secondary | ICD-10-CM

## 2018-08-10 ENCOUNTER — Other Ambulatory Visit: Payer: Self-pay | Admitting: Family Medicine

## 2018-08-11 ENCOUNTER — Telehealth: Payer: Self-pay | Admitting: Family Medicine

## 2018-08-11 DIAGNOSIS — E8941 Symptomatic postprocedural ovarian failure: Secondary | ICD-10-CM

## 2018-08-11 DIAGNOSIS — L930 Discoid lupus erythematosus: Secondary | ICD-10-CM

## 2018-08-11 MED ORDER — CLOBETASOL PROPIONATE 0.05 % EX SOLN: 1.0000 "application " | Freq: Two times a day (BID) | CUTANEOUS | 1 refills | Status: AC | PRN

## 2018-08-11 NOTE — Telephone Encounter (Signed)
I have refilled clobetasol.  Vitamin D 50,000 is not a chronic medication and just needs to be taken for a short duration to replete defeciency

## 2018-08-11 NOTE — Telephone Encounter (Signed)
New Message  Pt states she has been trying to get her prescription filled but her insurance will only cover it if it is a 90 day supply, clobetasol (TEMOVATE) 0.05 % external solution and Vitamin D. Please f/u

## 2018-08-11 NOTE — Telephone Encounter (Signed)
Will route to PCP for review. 

## 2018-08-12 ENCOUNTER — Other Ambulatory Visit: Payer: Self-pay

## 2018-08-12 DIAGNOSIS — E8941 Symptomatic postprocedural ovarian failure: Secondary | ICD-10-CM

## 2018-08-12 MED ORDER — CLONIDINE HCL 0.1 MG PO TABS: 0.1000 mg | ORAL_TABLET | Freq: Every day | ORAL | 1 refills | Status: DC

## 2018-08-12 NOTE — Telephone Encounter (Signed)
Refilled

## 2018-08-12 NOTE — Telephone Encounter (Signed)
Ok to refill 

## 2018-08-12 NOTE — Telephone Encounter (Signed)
Patient was called and informed of medication being sent to pharmacy. Patient states that the wrong medication was requested. Patient was requesting Clonidine, to [facility] [address], [place]

## 2018-08-13 ENCOUNTER — Telehealth: Payer: Self-pay | Admitting: Family Medicine

## 2018-08-13 MED ORDER — BUTALBITAL-APAP-CAFF-COD 50-325-40-30 MG PO CAPS: 1.0000 | ORAL_CAPSULE | Freq: Four times a day (QID) | ORAL | 1 refills | Status: DC | PRN

## 2018-08-13 NOTE — Telephone Encounter (Signed)
Refilled

## 2018-08-13 NOTE — Telephone Encounter (Signed)
1) Medication(s) Requested (by name): butalbital-acetaminophen-caffeine (FIORICET WITH CODEINE) 50-325-40-30 MG capsule [[id number]]   2) Pharmacy of Choice: [address] [place]

## 2018-09-04 ENCOUNTER — Other Ambulatory Visit: Payer: Self-pay | Admitting: Family Medicine

## 2018-09-04 DIAGNOSIS — I10 Essential (primary) hypertension: Secondary | ICD-10-CM

## 2019-09-20 ENCOUNTER — Other Ambulatory Visit: Payer: Self-pay | Admitting: Family Medicine

## 2019-09-20 DIAGNOSIS — E8941 Symptomatic postprocedural ovarian failure: Secondary | ICD-10-CM

## 2021-09-06 ENCOUNTER — Other Ambulatory Visit: Payer: Self-pay | Admitting: Family Medicine

## 2021-09-06 ENCOUNTER — Other Ambulatory Visit: Payer: Self-pay | Admitting: Internal Medicine

## 2021-09-06 DIAGNOSIS — Z1231 Encounter for screening mammogram for malignant neoplasm of breast: Secondary | ICD-10-CM

## 2021-09-12 ENCOUNTER — Ambulatory Visit
Admission: RE | Admit: 2021-09-12 | Discharge: 2021-09-12 | Disposition: A | Discharge status: Home / Self Care | Payer: Managed Care, Other (non HMO) | Source: Ambulatory Visit | Attending: Internal Medicine | Admitting: Internal Medicine

## 2021-09-12 DIAGNOSIS — Z1231 Encounter for screening mammogram for malignant neoplasm of breast: Secondary | ICD-10-CM

## 2022-04-12 ENCOUNTER — Ambulatory Visit (HOSPITAL_COMMUNITY)
Admission: EM | Admit: 2022-04-12 | Discharge: 2022-04-12 | Disposition: A | Discharge status: Home / Self Care | Payer: Managed Care, Other (non HMO) | Attending: Nurse Practitioner | Admitting: Nurse Practitioner

## 2022-04-12 ENCOUNTER — Encounter (HOSPITAL_COMMUNITY): Payer: Self-pay

## 2022-04-12 DIAGNOSIS — Z1152 Encounter for screening for COVID-19: Secondary | ICD-10-CM | POA: Diagnosis present

## 2022-04-12 DIAGNOSIS — B349 Viral infection, unspecified: Secondary | ICD-10-CM | POA: Diagnosis present

## 2022-04-12 DIAGNOSIS — J029 Acute pharyngitis, unspecified: Secondary | ICD-10-CM

## 2022-04-12 LAB — POC INFLUENZA A AND B ANTIGEN (URGENT CARE ONLY)
INFLUENZA A ANTIGEN, POC: NEGATIVE
INFLUENZA B ANTIGEN, POC: NEGATIVE

## 2022-04-12 LAB — POCT RAPID STREP A, ED / UC: Streptococcus, Group A Screen (Direct): NEGATIVE

## 2022-04-12 LAB — SARS CORONAVIRUS 2 (TAT 6-24 HRS): SARS Coronavirus 2: POSITIVE — AB

## 2022-04-12 MED ORDER — BENZONATATE 200 MG PO CAPS: 200.0000 mg | ORAL_CAPSULE | Freq: Three times a day (TID) | ORAL | 0 refills | Status: DC | PRN

## 2022-04-12 MED ORDER — LIDOCAINE VISCOUS HCL 2 % MT SOLN: 15.0000 mL | OROMUCOSAL | 0 refills | Status: AC | PRN

## 2022-04-12 NOTE — Discharge Instructions (Addendum)
Your strep and flu test was negative. Your symptoms are likely due to a viral respiratory infection. A respiratory infection is an illness that affects part of the respiratory system, such as the lungs, nose, or throat. Antibiotic medicines are not prescribed for viral infections. This is because antibiotics are designed to kill bacteria. They do not kill viruses. Take medications as prescribed. You may take tylenol or ibuprofen as needed for fevers/headache/body aches. Drink plenty of fluids. Stay in home isolation until you receive results of your COVID test. You will only be notified for positive results. You may go online to [facility] and review your results. Go to the ED immediately if you get worse or have any other symptoms.

## 2022-04-12 NOTE — ED Provider Notes (Signed)
[facility]    CSN: [id number] Arrival date & time: 04/12/22  0175      History   Chief Complaint Chief Complaint  Patient presents with   Emesis   Diarrhea   Generalized Body Aches    HPI Holly Valencia is a 46 y.o. female.   Subjective:   Holly Valencia is a 46 y.o. female who presents for evaluation of symptoms of a URI. Symptoms include fevers up to 101 degrees, hot and cold spells, sore throat, headache, myalgias, lightheadedness, nausea, chills and nasal congestion.  Onset of symptoms was 1 day ago and has been unchanged since that time.  Notably, patient reports having a "stomach bug" last week with nausea, vomiting and diarrhea which lasted a few days then resolved.  Patient denies any cough, runny nose, vomiting or diarrhea.  No known sick contacts.  She is drinking plenty of fluids.  She has tried TheraFlu and Alka-Seltzer with minimal relief in her symptoms.    The following portions of the patient's history were reviewed and updated as appropriate: allergies, current medications, past family history, past medical history, past social history, past surgical history, and problem list.       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   HTN (hypertension) 10/13/2014   Obesity 04/19/2014   Elevated BP 04/19/2014   DUB (dysfunctional uterine bleeding) 05/18/2012   Fibroid uterus 03/22/2011    Past Surgical History:  Procedure Laterality Date   ABDOMINAL HYSTERECTOMY     NO PAST SURGERIES     SUPRACERVICAL ABDOMINAL HYSTERECTOMY N/A 11/25/2012    Procedure: HYSTERECTOMY SUPRACERVICAL ABDOMINAL;  Surgeon: Mora Bellman, MD;  Location: [facility];  Service: Gynecology;  Laterality: N/A;    OB History     Gravida  2   Para  1   Term  1   Preterm  0   AB  1   Living  1      SAB  0   IAB  1   Ectopic  0   Multiple  0   Live Births               Home Medications    Prior to Admission medications   Medication Sig Start Date End Date Taking? Authorizing Provider  benzonatate (TESSALON) 200 MG capsule Take 1 capsule (200 mg total) by mouth 3 (three) times daily as needed for cough. 04/12/22  Yes Enrique Sack, FNP  cetirizine (ZYRTEC) 10 MG tablet Take 1 tablet (10 mg total) by mouth daily. 04/11/16  Yes Tereasa Coop, PA-C  clobetasol (TEMOVATE) 0.05 % external solution Apply 1 application topically 2 (two) times daily as needed (dry skin and rash). 08/11/18  Yes Charlott Rakes, MD  clobetasol ointment (TEMOVATE) 0.05 % Apply topically 2 (two) times daily. 04/13/18  Yes Charlott Rakes, MD  hydroxychloroquine (PLAQUENIL) 200 MG tablet Take 200 mg by mouth 2 (two) times daily. 02/06/22 05/07/22 Yes [provider]  lidocaine (XYLOCAINE) 2 % solution Use as directed 15 mLs in the mouth or throat every 3 (three) hours as needed (sore throat). Gargle with 15 mLs every 3 (three) hours as needed for sore throat 04/12/22  Yes Enrique Sack, FNP  losartan (COZAAR) 50 MG tablet Take 1 tablet (50 mg total) by mouth daily. MUST MAKE APPT FOR FURTHER REFILLS 03/03/18  Yes Charlott Rakes, MD  Multiple Vitamins-Iron (MULTIVITAMINS WITH IRON) TABS Take 1 tablet by mouth daily.   Yes [provider]  propranolol ER (INDERAL LA) 60 MG 24 hr capsule Take 60 mg by mouth daily.   Yes [provider]  famotidine (PEPCID) 40 MG tablet Take 1 tablet (40 mg total) by mouth 2 (two) times daily. Patient not taking: Reported on 04/08/2018 11/12/17   Tereasa Coop, PA-C  fluconazole (DIFLUCAN) 150 MG  tablet Take one tablet by mouth as a single dose. Patient not taking: Reported on 04/08/2018 02/23/18   Vanessa Kick, MD  gabapentin (NEURONTIN) 100 MG capsule Take 100 mg by mouth 3 (three) times daily.    [provider]  ketoconazole (NIZORAL) 2 % cream Apply 1 application daily topically. Patient not taking: Reported on 09/03/2017 02/26/17   Charlott Rakes, MD  montelukast (SINGULAIR) 10 MG tablet Take 1 tablet (10 mg total) by mouth at bedtime. Patient not taking: Reported on 04/08/2018 09/03/17   Charlott Rakes, MD  polyethylene glycol powder (GLYCOLAX/MIRALAX) powder TAKE 17 G BY MOUTH DAILY. Patient not taking: Reported on 09/03/2017 12/12/16   Levin Erp, PA  predniSONE (STERAPRED UNI-PAK 21 TAB) 10 MG (21) TBPK tablet Take by mouth daily. Take as directed. Patient not taking: Reported on 04/08/2018 02/23/18   Vanessa Kick, MD  losartan-hydrochlorothiazide (HYZAAR) 100-25 MG per tablet Take 1 tablet by mouth daily. 04/28/14 04/28/14  Lance Bosch, NP    Family History Family History  Problem Relation Age of Onset   Diabetes Mother    Hypertension Mother    Hyperlipidemia Father    Breast cancer Paternal Aunt    Cancer Paternal Aunt    Breast cancer Paternal Aunt    Diabetes Maternal Grandmother    Diabetes Paternal Grandmother    Colon cancer Other     Social History Social History   Tobacco Use   Smoking status: Never   Smokeless tobacco: Never  Substance Use Topics   Alcohol use: Yes    Alcohol/week: 0.0 standard drinks of alcohol    Comment: occasional   Drug use: No     Allergies   Amoxicillin, Latex, and Penicillins   Review of Systems Review of Systems  Constitutional:  Positive for chills, fatigue and fever.  HENT:  Positive for congestion and sore throat. Negative for ear pain and rhinorrhea.   Respiratory:  Positive for cough. Negative for shortness of breath and wheezing.   Gastrointestinal:  Negative for diarrhea, nausea and  vomiting.  Musculoskeletal:  Positive for myalgias.  Neurological:  Positive for light-headedness and headaches.  All other systems reviewed and are negative.    Physical Exam Triage Vital Signs ED Triage Vitals  Enc Vitals Group     BP 04/12/22 0836 (!) 152/108     Pulse Rate 04/12/22 0836 98     Resp 04/12/22 0836 18     Temp 04/12/22 0836 98.1 F (36.7 C)     Temp Source 04/12/22 0836 Oral     SpO2 04/12/22 0836 96 %     Weight --  Height --      Head Circumference --      Peak Flow --      Pain Score 04/12/22 0837 8     Pain Loc --      Pain Edu? --      Excl. in [place]? --    No data found.  Updated Vital Signs BP (!) 152/108 (BP Location: Right Arm)   Pulse 98   Temp 98.1 F (36.7 C) (Oral)   Resp 18   LMP 11/12/2012   SpO2 96%   Visual Acuity Right Eye Distance:   Left Eye Distance:   Bilateral Distance:    Right Eye Near:   Left Eye Near:    Bilateral Near:     Physical Exam Vitals reviewed.  Constitutional:      General: She is not in acute distress.    Appearance: Normal appearance. She is not ill-appearing, toxic-appearing or diaphoretic.  HENT:     Head: Normocephalic.     Right Ear: Tympanic membrane, ear canal and external ear normal.     Left Ear: Tympanic membrane, ear canal and external ear normal.     Nose: Congestion present.     Mouth/Throat:     Mouth: Mucous membranes are moist.     Pharynx: Oropharynx is clear. Uvula midline. Posterior oropharyngeal erythema present. No pharyngeal swelling, oropharyngeal exudate or uvula swelling.     Tonsils: No tonsillar exudate.  Eyes:     Conjunctiva/sclera: Conjunctivae normal.  Cardiovascular:     Rate and Rhythm: Normal rate and regular rhythm.  Pulmonary:     Effort: Pulmonary effort is normal.     Breath sounds: Normal breath sounds.  Abdominal:     Palpations: Abdomen is soft.  Musculoskeletal:        General: Normal range of motion.     Cervical back: Normal range of motion and  neck supple.  Lymphadenopathy:     Cervical: No cervical adenopathy.  Skin:    General: Skin is warm and dry.  Neurological:     General: No focal deficit present.     Mental Status: She is alert and oriented to person, place, and time.      UC Treatments / Results  Labs (all labs ordered are listed, but only abnormal results are displayed) Labs Reviewed  CULTURE, GROUP A STREP ([place])  SARS CORONAVIRUS 2 (TAT 6-24 HRS)  POC INFLUENZA A AND B ANTIGEN (URGENT CARE ONLY)  POCT RAPID STREP A, ED / UC    EKG   Radiology No results found.  Procedures Procedures (including critical care time)  Medications Ordered in UC Medications - No data to display  Initial Impression / Assessment and Plan / UC Course  I have reviewed the triage vital signs and the nursing notes.  Pertinent labs & imaging results that were available during my care of the patient were reviewed by me and considered in my medical decision making (see chart for details).    46 year old female presenting with an acute onset of fevers, sore throat, headache, myalgias, lightheadedness, nausea, chills and nasal congestion.  She is afebrile.  Nontoxic.  Physical exam as above.  Flu negative.  Strep negative. COVID pending. Discussed diagnosis and treatment of URI. Suggested symptomatic OTC remedies. Follow up as needed.  Today's evaluation has revealed no signs of a dangerous process. Discussed diagnosis with patient and/or guardian. Patient and/or guardian aware of their diagnosis, possible red flag symptoms to watch out for and need  for close follow up. Patient and/or guardian understands verbal and written discharge instructions. Patient and/or guardian comfortable with plan and disposition.  Patient and/or guardian has a clear mental status at this time, good insight into illness (after discussion and teaching) and has clear judgment to make decisions regarding their care  Documentation was completed with the aid  of voice recognition software. Transcription may contain typographical errors. Final Clinical Impressions(s) / UC Diagnoses   Final diagnoses:  Viral illness  Encounter for screening for COVID-19     Discharge Instructions      Your strep and flu test was negative. Your symptoms are likely due to a viral respiratory infection. A respiratory infection is an illness that affects part of the respiratory system, such as the lungs, nose, or throat. Antibiotic medicines are not prescribed for viral infections. This is because antibiotics are designed to kill bacteria. They do not kill viruses. Take medications as prescribed. You may take tylenol or ibuprofen as needed for fevers/headache/body aches. Drink plenty of fluids. Stay in home isolation until you receive results of your COVID test. You will only be notified for positive results. You may go online to [place] and review your results. Go to the ED immediately if you get worse or have any other symptoms.        ED Prescriptions     Medication Sig Dispense Auth. Provider   lidocaine (XYLOCAINE) 2 % solution Use as directed 15 mLs in the mouth or throat every 3 (three) hours as needed (sore throat). Gargle with 15 mLs every 3 (three) hours as needed for sore throat 100 mL Enrique Sack, FNP   benzonatate (TESSALON) 200 MG capsule Take 1 capsule (200 mg total) by mouth 3 (three) times daily as needed for cough. 30 capsule Enrique Sack, FNP      PDMP not reviewed this encounter.   Enrique Sack, [place] 04/12/22 1029

## 2022-04-12 NOTE — ED Triage Notes (Signed)
Nausea, diarrhea last week for 3 days then went away,now Wednesday started having diarrhea, vomiting, sore throat, lightheaded, headache, body aches, fever at home yesterday of 101. Taking Theraflu, vit C, ibuprofen, and tylenol.

## 2022-04-13 ENCOUNTER — Telehealth (HOSPITAL_COMMUNITY): Payer: Self-pay | Admitting: Emergency Medicine

## 2022-04-13 MED ORDER — ALBUTEROL SULFATE HFA 108 (90 BASE) MCG/ACT IN AERS: 2.0000 | INHALATION_SPRAY | Freq: Four times a day (QID) | RESPIRATORY_TRACT | 0 refills | Status: DC | PRN

## 2022-04-13 MED ORDER — NIRMATRELVIR/RITONAVIR (PAXLOVID)TABLET: 3.0000 | ORAL_TABLET | Freq: Two times a day (BID) | ORAL | 0 refills | Status: DC

## 2022-04-13 NOTE — Telephone Encounter (Signed)
Patient with positive COVID result. Sent paxlovid to pharmacy. Last GFR > 60. Patient requesting refill of albuterol. No inhaler seen on med list. Sent one to pharmacy. Patient contacted by [facility] CMA with results and available medication.

## 2022-04-14 ENCOUNTER — Telehealth (HOSPITAL_COMMUNITY): Payer: Self-pay | Admitting: Emergency Medicine

## 2022-04-14 LAB — CULTURE, GROUP A STREP (THRC)

## 2022-04-14 MED ORDER — ALBUTEROL SULFATE HFA 108 (90 BASE) MCG/ACT IN AERS: 2.0000 | INHALATION_SPRAY | Freq: Four times a day (QID) | RESPIRATORY_TRACT | 0 refills | Status: AC | PRN

## 2022-04-14 MED ORDER — NIRMATRELVIR/RITONAVIR (PAXLOVID)TABLET: 3.0000 | ORAL_TABLET | Freq: Two times a day (BID) | ORAL | 0 refills | Status: AC

## 2022-04-14 NOTE — Telephone Encounter (Signed)
Changed pharmacy for patient. Resent medications.

## 2022-08-22 ENCOUNTER — Other Ambulatory Visit: Payer: Self-pay | Admitting: Internal Medicine

## 2022-08-22 DIAGNOSIS — Z1231 Encounter for screening mammogram for malignant neoplasm of breast: Secondary | ICD-10-CM

## 2022-09-18 ENCOUNTER — Ambulatory Visit
Admission: RE | Admit: 2022-09-18 | Discharge: 2022-09-18 | Disposition: A | Discharge status: Home / Self Care | Payer: Managed Care, Other (non HMO) | Source: Ambulatory Visit | Attending: Internal Medicine | Admitting: Internal Medicine

## 2022-09-18 DIAGNOSIS — Z1231 Encounter for screening mammogram for malignant neoplasm of breast: Secondary | ICD-10-CM

## 2023-08-26 IMAGING — MG MM DIGITAL SCREENING BILAT W/ TOMO AND CAD
6 of 10 series · 6 of 30 positions shown · non-contrast
Comparison: Previous exam(s).

CLINICAL DATA: Screening.

EXAM:
DIGITAL SCREENING BILATERAL MAMMOGRAM WITH TOMOSYNTHESIS AND CAD
TECHNIQUE: Bilateral screening digital craniocaudal and mediolateral oblique
mammograms were obtained. Bilateral screening digital breast
tomosynthesis was performed. The images were evaluated with
computer-aided detection.

[R MLO synth-2D]
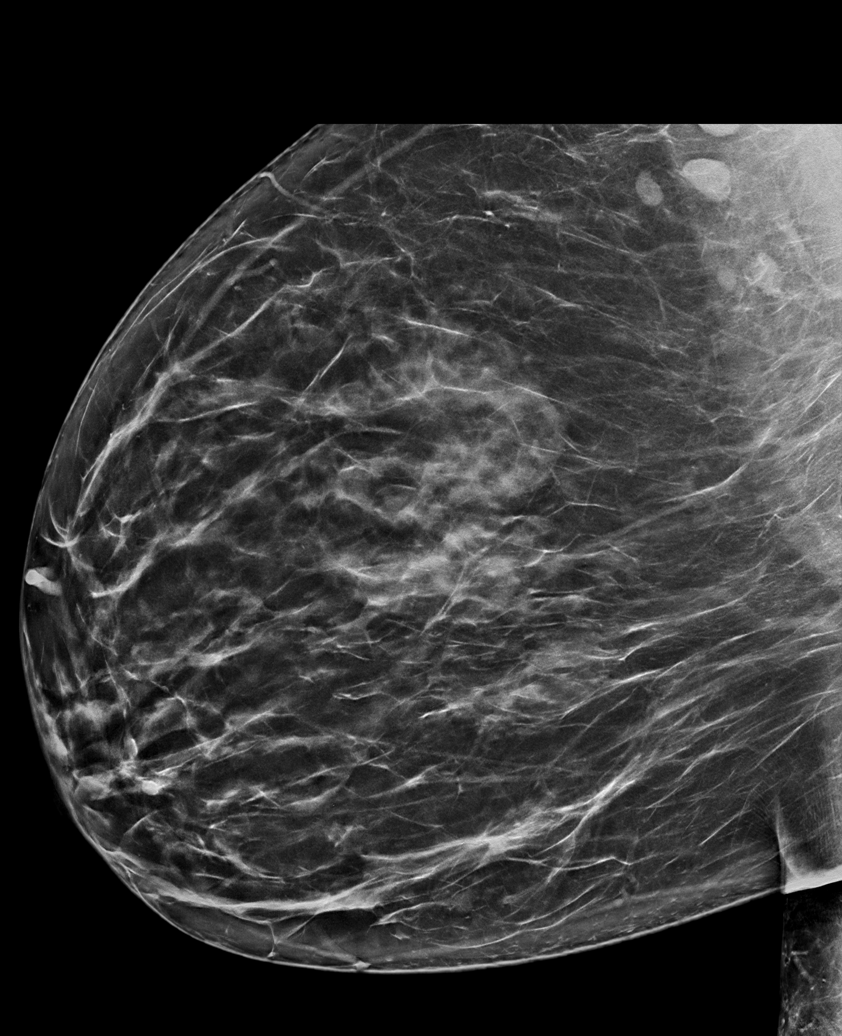

[L MLO synth-2D]
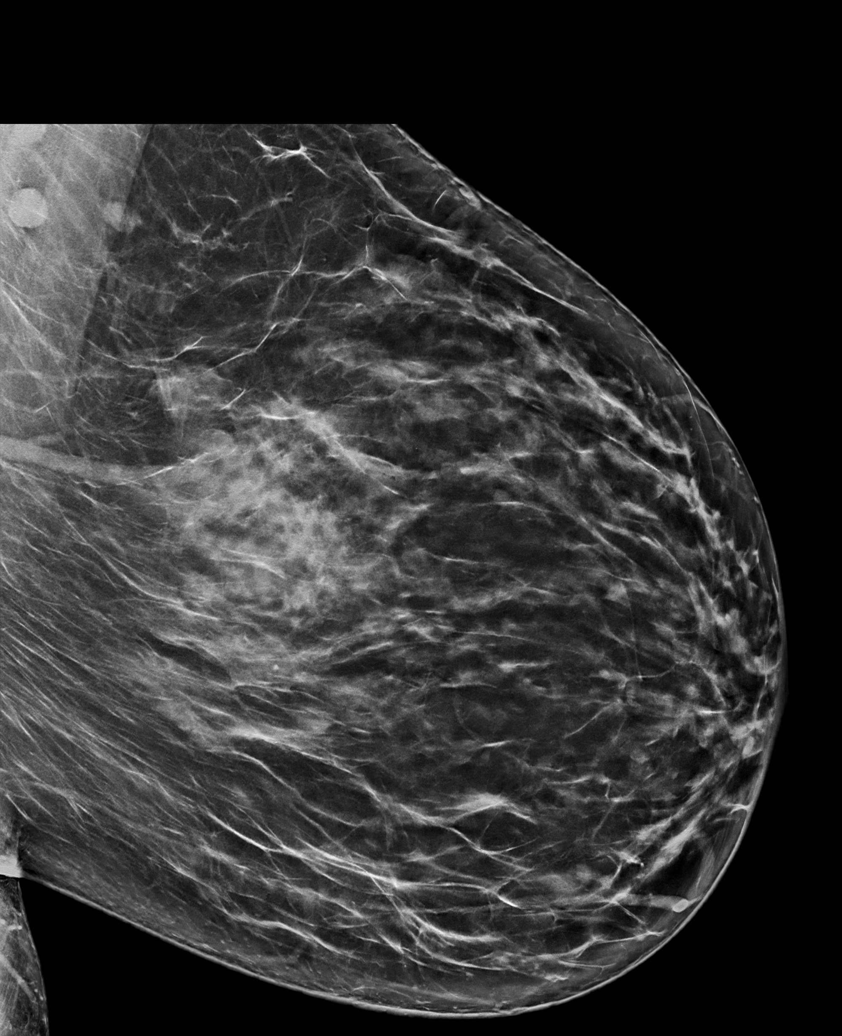

[L CC synth-2D]
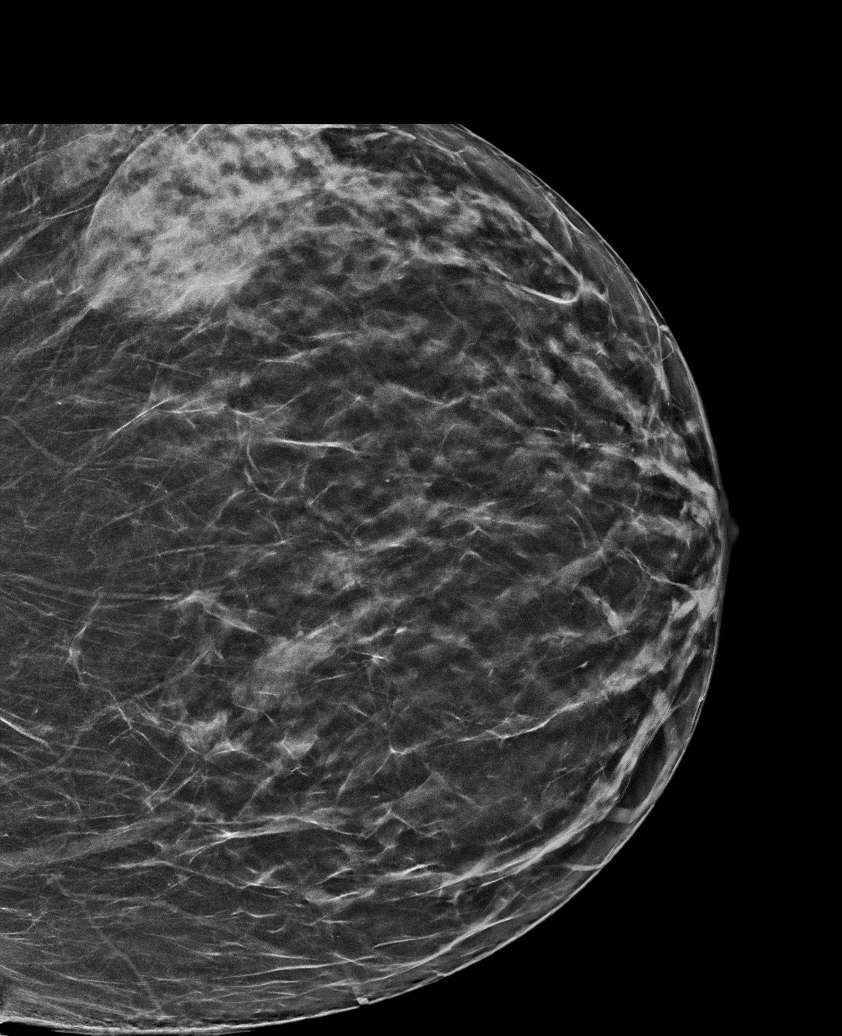

[L CV synth-2D]
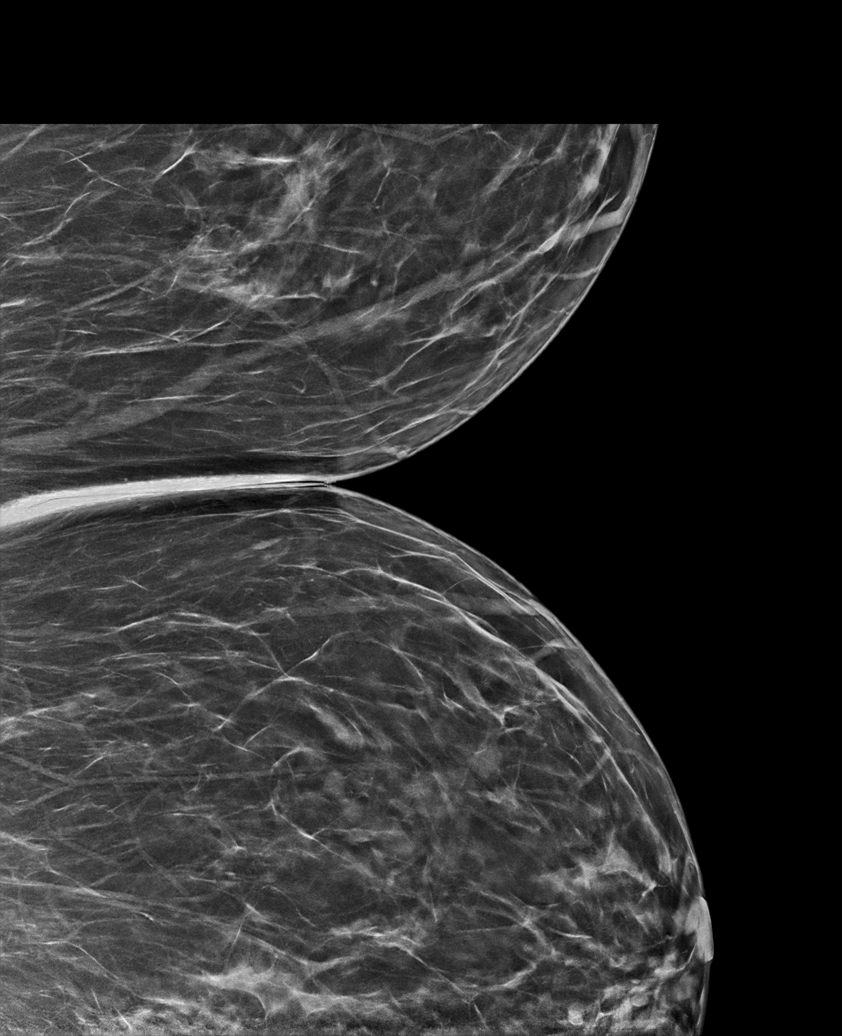

[R CC synth-2D]
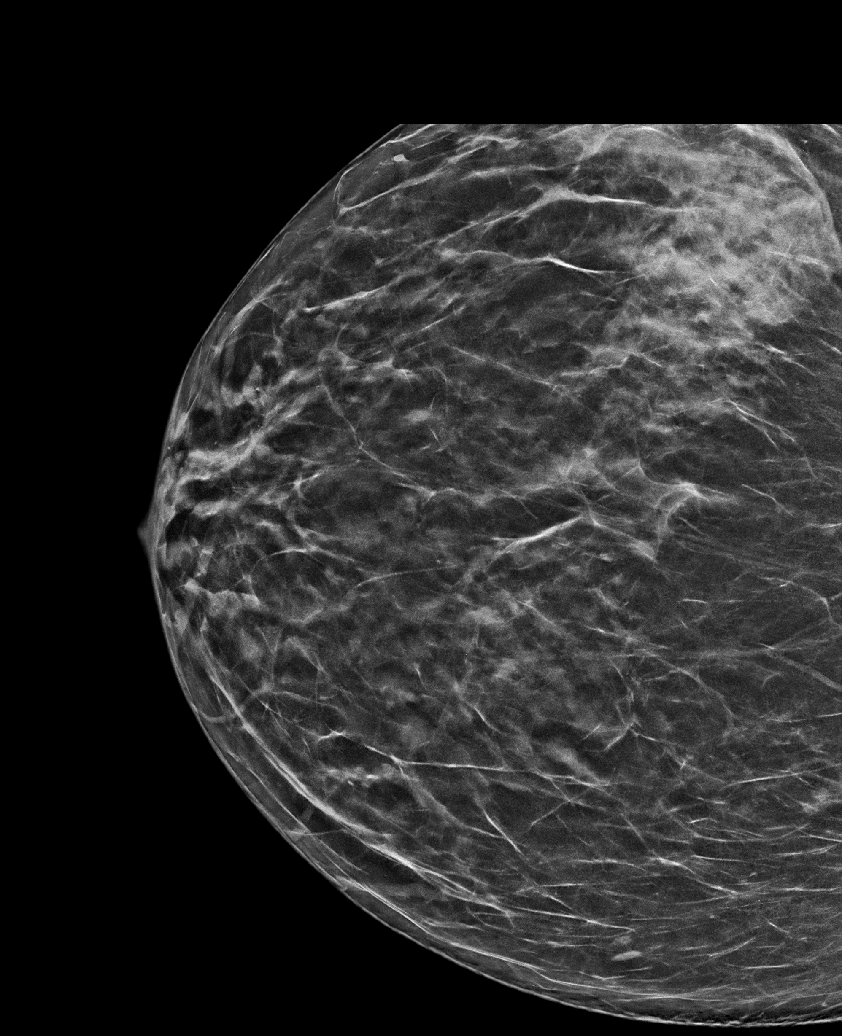

[R CC tomo · tomo slice 33/65.0]
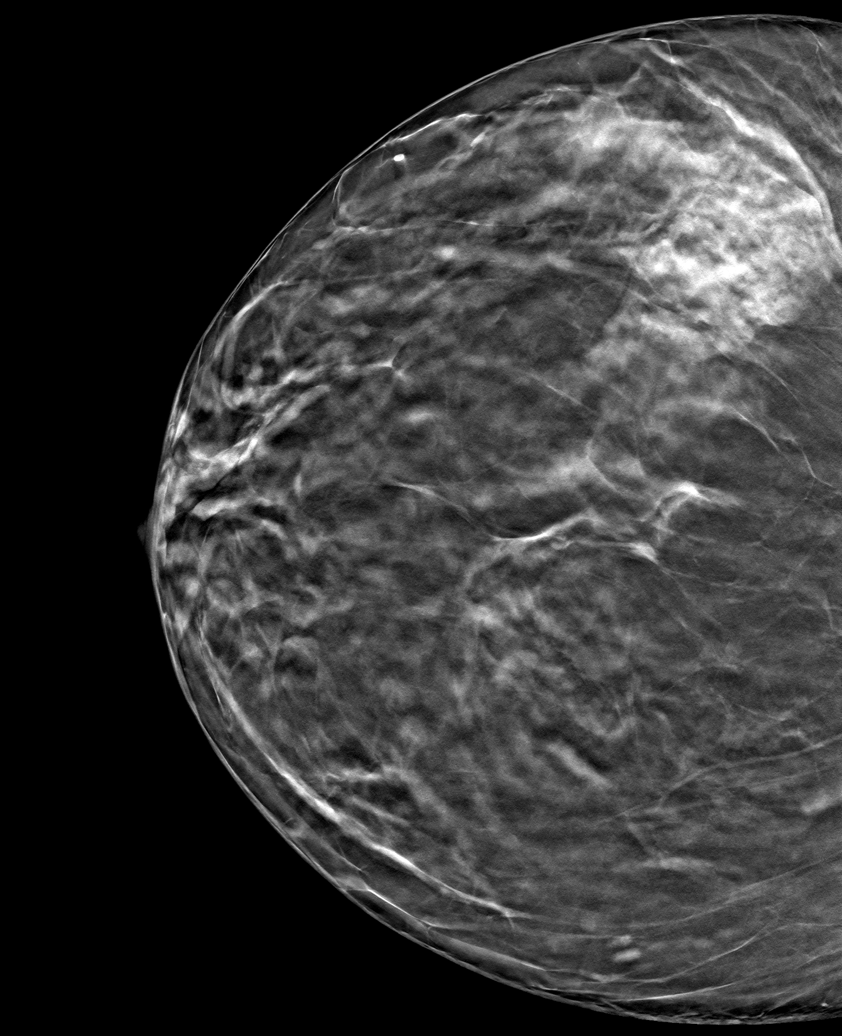

[6 of 30 positions shown; findings below may reference images not displayed]

ACR Breast Density Category c: The breast tissue is heterogeneously
dense, which may obscure small masses.
FINDINGS: There are no findings suspicious for malignancy.
IMPRESSION: No mammographic evidence of malignancy. A result letter of this
screening mammogram will be mailed directly to the patient.

RECOMMENDATION:
Screening mammogram in one year. (Code:Q3-W-BC3)

BI-RADS CATEGORY  1: Negative.

## 2023-11-04 ENCOUNTER — Encounter (HOSPITAL_COMMUNITY): Payer: Self-pay

## 2023-11-04 ENCOUNTER — Ambulatory Visit (HOSPITAL_COMMUNITY): Admission: EM | Admit: 2023-11-04 | Discharge: 2023-11-04 | Disposition: A | Discharge status: Home / Self Care

## 2023-11-04 DIAGNOSIS — M5441 Lumbago with sciatica, right side: Secondary | ICD-10-CM

## 2023-11-04 HISTORY — DX: Rheumatoid arthritis, unspecified: M06.9

## 2023-11-04 MED ORDER — CYCLOBENZAPRINE HCL 5 MG PO TABS: 5.0000 mg | ORAL_TABLET | Freq: Three times a day (TID) | ORAL | 0 refills | Status: AC

## 2023-11-04 MED ORDER — PREDNISONE 20 MG PO TABS: 40.0000 mg | ORAL_TABLET | Freq: Every day | ORAL | 0 refills | Status: AC

## 2023-11-04 NOTE — ED Provider Notes (Signed)
 [facility]    CSN: [id number] Arrival date & time: 11/04/23  1730      History   Chief Complaint Chief Complaint  Patient presents with   Back Pain    HPI Holly Valencia is a 48 y.o. female.   HPI Patient is a 48 year old female who presents to the urgent care today with concerns of low back pain that radiates down the right leg.  She reports her symptoms started on Saturday.  She has been taking Tylenol  and trying ice and heat to help with her symptoms.  She denies any specific fall, trauma, or injury which would have caused her symptoms.  She does report a history of RA and is unsure if her symptoms are related to that.  She denies any fever, rash, loss of sensation, urinary retention/incontinence, pain out of proportion, or other concerns at this time. 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   HTN (hypertension) 10/13/2014   Obesity 04/19/2014   Elevated BP 04/19/2014   DUB (dysfunctional uterine bleeding) 05/18/2012   Fibroid uterus 03/22/2011    Past Surgical History:  Procedure Laterality Date   ABDOMINAL HYSTERECTOMY     NO PAST SURGERIES     SUPRACERVICAL ABDOMINAL HYSTERECTOMY N/A 11/25/2012   Procedure: HYSTERECTOMY SUPRACERVICAL ABDOMINAL;  Surgeon: Winton Felt, MD;  Location: [facility];  Service: Gynecology;  Laterality: N/A;    OB History     Gravida  2   Para  1   Term  1   Preterm  0   AB  1   Living  1      SAB  0   IAB  1   Ectopic  0   Multiple  0   Live Births                Home Medications    Prior to Admission medications   Medication Sig Start Date End Date Taking? Authorizing Provider  cloNIDine  (CATAPRES ) 0.1 MG tablet Take 0.1 mg by mouth. 04/18/23  Yes [provider]  cyclobenzaprine  (FLEXERIL ) 5 MG tablet Take 1 tablet (5 mg total) by mouth 3 (three) times daily for 5 days. 11/04/23 11/09/23 Yes Melonie Locus, PA-C  etanercept (ENBREL) 50 MG/ML injection Inject 50 mg into the skin. 05/23/23  Yes [provider]  folic acid (FOLVITE) 1 MG tablet Take 1 mg by mouth. 04/21/23 04/15/24 Yes [provider]  hydroxychloroquine (PLAQUENIL) 200 MG tablet Take 200 mg by mouth 2 (two) times daily.   Yes [provider]  methotrexate (RHEUMATREX) 2.5 MG tablet Take 20 mg by mouth. 07/22/23 01/18/24 Yes [provider]  predniSONE  (DELTASONE ) 20 MG tablet Take 2 tablets (40 mg total) by mouth daily with breakfast for 5 days. 11/04/23 11/09/23 Yes Melonie Locus, PA-C  tirzepatide (ZEPBOUND) 5 MG/0.5ML Pen ADMINISTER 5 MG UNDER THE SKIN EVERY 7 DAYS 09/01/23  Yes [provider]  acetaminophen  (TYLENOL ) 650 MG CR tablet Take 650 mg by mouth.    [provider]  albuterol  (VENTOLIN  HFA) 108 (90 Base) MCG/ACT inhaler Inhale 2 puffs into the lungs every 6 (six) hours as needed for wheezing or shortness of breath. 04/14/22   Rising, [place], PA-C  cetirizine  (ZYRTEC ) 10 MG tablet Take 1 tablet (10 mg total) by mouth daily. 04/11/16   Gretta Ozell CROME, PA-C  clobetasol  (TEMOVATE ) 0.05 % external solution Apply 1 application topically 2 (two) times daily as needed (dry skin and rash). 08/11/18   Newlin, Enobong, MD  clobetasol  ointment (TEMOVATE ) 0.05 % Apply topically 2 (two) times daily. 04/13/18   Newlin, Enobong, MD  famotidine  (PEPCID ) 40 MG tablet Take 1 tablet (40 mg total) by mouth 2 (two) times daily. Patient not taking: Reported on 04/08/2018 11/12/17   Gretta Ozell CROME, PA-C  fluconazole  (DIFLUCAN ) 150 MG  tablet Take one tablet by mouth as a single dose. Patient not taking: Reported on 04/08/2018 02/23/18   Rolinda Rogue, MD  gabapentin (NEURONTIN) 100 MG capsule Take 100 mg by mouth 3 (three) times daily.    [provider]  ketoconazole  (NIZORAL ) 2 % cream Apply 1 application daily topically. Patient not taking: Reported on 09/03/2017 02/26/17   Newlin, Enobong, MD  lidocaine  (XYLOCAINE ) 2 % solution Use as directed 15 mLs in the mouth or throat every 3 (three) hours as needed (sore throat). Gargle with 15 mLs every 3 (three) hours as needed for sore throat 04/12/22   Iola Lukes, FNP  losartan  (COZAAR ) 50 MG tablet Take 1 tablet (50 mg total) by mouth daily. MUST MAKE APPT FOR FURTHER REFILLS 03/03/18   Delbert Clam, MD  montelukast  (SINGULAIR ) 10 MG tablet Take 1 tablet (10 mg total) by mouth at bedtime. Patient not taking: Reported on 04/08/2018 09/03/17   Newlin, Enobong, MD  Multiple Vitamins-Iron (MULTIVITAMINS WITH IRON) TABS Take 1 tablet by mouth daily.    [provider]  polyethylene glycol powder (GLYCOLAX /MIRALAX ) powder TAKE 17 G BY MOUTH DAILY. Patient not taking: Reported on 09/03/2017 12/12/16   Beather Delon Gibson, PA  propranolol ER (INDERAL LA) 60 MG 24 hr capsule Take 60 mg by mouth daily. Patient not taking: Reported on 11/04/2023    [provider]  losartan -hydrochlorothiazide (HYZAAR) 100-25 MG per tablet Take 1 tablet by mouth daily. 04/28/14 04/28/14  Oleta Berwyn LABOR, NP    Family History Family History  Problem Relation Age of Onset   Diabetes Mother    Hypertension Mother    Hyperlipidemia Father    Breast cancer Paternal Aunt    Cancer Paternal Aunt    Breast cancer Paternal Aunt    Diabetes Maternal Grandmother    Diabetes Paternal Grandmother    Colon cancer Other     Social History Social History   Tobacco Use   Smoking status: Never   Smokeless tobacco: Never  Vaping Use   Vaping status: Never Used  Substance  Use Topics   Alcohol use: Yes    Alcohol/week: 0.0 standard drinks of alcohol    Comment: occasional   Drug use: No     Allergies   Oxycodone , Amoxicillin , Hydrocodone , Latex, and Penicillins   Review of Systems Review of Systems See HPI for relevant ROS.  Physical Exam Triage Vital Signs ED Triage Vitals  Encounter Vitals Group     BP 11/04/23 1800 (!) 160/95     Girls Systolic BP Percentile --      Girls Diastolic BP  Percentile --      Boys Systolic BP Percentile --      Boys Diastolic BP Percentile --      Pulse Rate 11/04/23 1800 87     Resp 11/04/23 1800 18     Temp 11/04/23 1800 98.3 F (36.8 C)     Temp Source 11/04/23 1800 Oral     SpO2 11/04/23 1800 98 %     Weight --      Height --      Head Circumference --      Peak Flow --      Pain Score 11/04/23 1801 10     Pain Loc --      Pain Education --      Exclude from Growth Chart --    No data found.  Updated Vital Signs BP (!) 160/95 (BP Location: Right Arm)   Pulse 87   Temp 98.3 F (36.8 C) (Oral)   Resp 18   LMP 11/12/2012   SpO2 98%   Visual Acuity Right Eye Distance:   Left Eye Distance:   Bilateral Distance:    Right Eye Near:   Left Eye Near:    Bilateral Near:     Physical Exam General: Alert and oriented, well-developed/well-nourished, calm, cooperative, no acute distress HEENT: Normocephalic atraumatic, moist mucous membranes, no scleral icterus, trachea midline Lungs: Speaking full sentences, non-labored respirations, no distress Heart: Regular rate and rhythm Abdomen:  Soft, nondistended Back: No CVA tenderness Musculoskeletal: Moves all extremities well, full and equal strength bilaterally in the lower extremities, reproduction of the pain radiating down the right leg with straight leg raise, tenderness to percussion of the right lower back Pulses: 2+ pedal bilaterally Neurologic: Awake, A&O x4, full sensation to light touch in the lower extremities Integumentary: Warm, dry,  normal for ethnicity, intact, no rash Psychiatric: Appropriate mood & affect  UC Treatments / Results  Labs (all labs ordered are listed, but only abnormal results are displayed) Labs Reviewed - No data to display  EKG   Radiology No results found.  Procedures Procedures (including critical care time)  Medications Ordered in UC Medications - No data to display  Initial Impression / Assessment and Plan / UC Course  I have reviewed the triage vital signs and the nursing notes.  Pertinent labs & imaging results that were available during my care of the patient were reviewed by me and considered in my medical decision making (see chart for details).    Presents with low back pain.  Differential diagnosis includes: Strain, sprain, disc herniation, spondylolisthesis, cauda equina, spinal abscess, spinal stenosis, pyelonephritis, nephrolithiasis, AAA, meningitis, malignancy, including other diagnoses.   History obtained from: Patient.  Plan: Presents with back pain. No back pain red flags on history or physical. Presentation not consistent with malignancy (lack of history of malignancy, lack of B symptoms), fracture (no trauma, no bony tenderness to palpation), cauda equina (no bowel or urinary incontinence/retention, no saddle anesthesia, no distal weakness), AAA, viscus perforation, osteomyelitis or epidural abscess (no IVDU, vertebral tenderness), renal colic, pyelonephritis (afebrile, no CVAT, no urinary symptoms). Given the clinical picture, no indication for imaging at this time.  Patient can take ibuprofen  and Tylenol  as needed for pain relief.  Prescribed patient a short course of cyclobenzaprine  and prednisone . Patient should follow-up with their primary care provider in the next several days. Strict return precautions discussed including neurologic symptoms, fever, worsening of symptoms, loss of sensation, urinary incontinence/retention, distal weakness, or if patient has any  other  concerns.  Disposition: Stable to discharge home.  All questions answered to the best of this examiner's ability. Advised to f/u with PCP for further eval and/or reassessment. Patient agrees to plan.  An appropriate evaluation has been performed, and in my medical judgment there is currently no evidence of an immediate life-threatening or surgical condition. Discharge is therefore indicated at this time.  This document was created using the aid of voice recognition Scientist, clinical (histocompatibility and immunogenetics).  Final Clinical Impressions(s) / UC Diagnoses   Final diagnoses:  Acute right-sided low back pain with right-sided sciatica     Discharge Instructions      We have sent in 2 medications to help with your symptoms.  One of these medications is a muscle relaxant.  Do not drive or operate heavy machinery on this medication.  This medication may make you tired.  We recommend taking the first dose before bedtime to see how your body reacts to it.  We recommend following up with your primary care provider within the next 1 to 2 weeks if symptoms are not improving.  Please go to emergency department immediately if you have any urinary retention/incontinence, loss of sensation, fever, worsening symptoms, or if you have any other concerns.    ED Prescriptions     Medication Sig Dispense Auth. Provider   predniSONE  (DELTASONE ) 20 MG tablet Take 2 tablets (40 mg total) by mouth daily with breakfast for 5 days. 10 tablet Melonie Locus, PA-C   cyclobenzaprine  (FLEXERIL ) 5 MG tablet Take 1 tablet (5 mg total) by mouth 3 (three) times daily for 5 days. 15 tablet Melonie Locus, PA-C      PDMP not reviewed this encounter.   Melonie Locus, PA-C 11/04/23 1851

## 2023-11-04 NOTE — Discharge Instructions (Signed)
 We have sent in 2 medications to help with your symptoms.  One of these medications is a muscle relaxant.  Do not drive or operate heavy machinery on this medication.  This medication may make you tired.  We recommend taking the first dose before bedtime to see how your body reacts to it.  We recommend following up with your primary care provider within the next 1 to 2 weeks if symptoms are not improving.  Please go to emergency department immediately if you have any urinary retention/incontinence, loss of sensation, fever, worsening symptoms, or if you have any other concerns.

## 2023-11-04 NOTE — ED Triage Notes (Signed)
 Pt c/o lower back pain radiating down rt leg and up rt back since Saturday night. Denies injury. Used ice/head and tylenol  with no relief.

## 2023-11-14 ENCOUNTER — Other Ambulatory Visit: Payer: Self-pay | Admitting: Internal Medicine

## 2023-11-14 DIAGNOSIS — Z1231 Encounter for screening mammogram for malignant neoplasm of breast: Secondary | ICD-10-CM

## 2023-11-26 ENCOUNTER — Ambulatory Visit
Admission: RE | Admit: 2023-11-26 | Discharge: 2023-11-26 | Disposition: A | Discharge status: Home / Self Care | Source: Ambulatory Visit | Attending: Internal Medicine | Admitting: Internal Medicine

## 2023-11-26 DIAGNOSIS — Z1231 Encounter for screening mammogram for malignant neoplasm of breast: Secondary | ICD-10-CM
